# Patient Record
Sex: Male | Born: 1995 | Hispanic: Yes | Marital: Single | Smoking: Former smoker
Health system: Southern US, Community
[De-identification: ages and names within clinical notes are randomized; demographics above are authoritative.]

## PROBLEM LIST (undated history)

## (undated) ENCOUNTER — Emergency Department (HOSPITAL_COMMUNITY): Admission: EM | Payer: Commercial Managed Care - PPO | Source: Home / Self Care

## (undated) DIAGNOSIS — F4310 Post-traumatic stress disorder, unspecified: Secondary | ICD-10-CM

## (undated) DIAGNOSIS — F32A Depression, unspecified: Secondary | ICD-10-CM

## (undated) DIAGNOSIS — G43909 Migraine, unspecified, not intractable, without status migrainosus: Secondary | ICD-10-CM

## (undated) DIAGNOSIS — F329 Major depressive disorder, single episode, unspecified: Secondary | ICD-10-CM

## (undated) DIAGNOSIS — F319 Bipolar disorder, unspecified: Secondary | ICD-10-CM

## (undated) DIAGNOSIS — N35919 Unspecified urethral stricture, male, unspecified site: Secondary | ICD-10-CM

## (undated) HISTORY — PX: APPENDECTOMY: SHX54

---
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
## 2015-06-18 ENCOUNTER — Encounter (HOSPITAL_COMMUNITY): Payer: Self-pay | Admitting: Emergency Medicine

## 2015-06-18 ENCOUNTER — Emergency Department (HOSPITAL_COMMUNITY): Payer: Medicaid Other

## 2015-06-18 ENCOUNTER — Emergency Department (HOSPITAL_COMMUNITY)
Admission: EM | Admit: 2015-06-18 | Discharge: 2015-06-18 | Disposition: A | Discharge status: Home / Self Care | Payer: Medicaid Other | Attending: Emergency Medicine | Admitting: Emergency Medicine

## 2015-06-18 DIAGNOSIS — Z8679 Personal history of other diseases of the circulatory system: Secondary | ICD-10-CM | POA: Diagnosis not present

## 2015-06-18 DIAGNOSIS — W228XXA Striking against or struck by other objects, initial encounter: Secondary | ICD-10-CM | POA: Diagnosis not present

## 2015-06-18 DIAGNOSIS — Y999 Unspecified external cause status: Secondary | ICD-10-CM | POA: Diagnosis not present

## 2015-06-18 DIAGNOSIS — Z720 Tobacco use: Secondary | ICD-10-CM | POA: Insufficient documentation

## 2015-06-18 DIAGNOSIS — Z91040 Latex allergy status: Secondary | ICD-10-CM | POA: Insufficient documentation

## 2015-06-18 DIAGNOSIS — Y9389 Activity, other specified: Secondary | ICD-10-CM | POA: Insufficient documentation

## 2015-06-18 DIAGNOSIS — Z87438 Personal history of other diseases of male genital organs: Secondary | ICD-10-CM | POA: Insufficient documentation

## 2015-06-18 DIAGNOSIS — S6991XA Unspecified injury of right wrist, hand and finger(s), initial encounter: Secondary | ICD-10-CM | POA: Diagnosis present

## 2015-06-18 DIAGNOSIS — F419 Anxiety disorder, unspecified: Secondary | ICD-10-CM | POA: Diagnosis not present

## 2015-06-18 DIAGNOSIS — Y929 Unspecified place or not applicable: Secondary | ICD-10-CM | POA: Insufficient documentation

## 2015-06-18 DIAGNOSIS — M79641 Pain in right hand: Secondary | ICD-10-CM
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
## 2015-06-18 NOTE — Discharge Instructions (Signed)
Behavioral Health Resources in the Community ° °Intensive Outpatient Programs: °[facility]      °[address] °[place], [place] °[phone] °Both a day and evening program °      °[facility]     °[address]        °[place], [place] [address] °[phone]        ° °[facility] °[address] °[place] °336-882-2125 ° °[facility] °ACCESS LINE: 1-800-853-5163 or 336-641-4981 °[address] °[place], [place] [address] °[email or web address].htm ° °Mobile Crisis Teams:         °                               °[facility]         °Mobile Crisis Care Unit °1-877-626-1772       °      °Assertive °[facility] °[address] [place] °336-834-9664 °                                        °[facility] °Sharon DeEsch °[address] °[place] °336-554-5454 ° °Self-Help/Support Groups: °[facility] Variety of support groups °373-1402 (call for more info) ° °Narcotics Anonymous (NA) °[facility] °[address] °[place] - 2 meetings at this location ° °Residential Treatment Programs:  °[facility]      °[address]        °[place]       °866-801-8205        ° °[facility] °[address] °[place], [place]  [address] °[phone] ° °[facility]  °[address] °[place], [place] [address] °[phone] °Admissions: 8am-3pm M-F ° °[facility]     °[address]        °[place], [place] [address]       °336-841-1104        ° °[facility] °[address] °[place], [place] °336-379-7146 ° °[facility] °[address] °[place], [place] °336-285-9073 ° °[facility]ensive [facility]      °[address]     °[place], [place]       °852-3033        ° °[facility]        °[address] °[place], [place] °877-615-2722 or 336-784-9470 ° °[facility]  °[address] °[place], [place] °336-227-7417 ° °[facility]                                               °[address] °[place] °800-659-3381 ° °[facility]: °[facility]- 1-888-581-9988              ° °General Therapy                                                °Julie Brannon, PhD        °  [address]                                       °[place], [place] [address]         °336-349-5553   °Insurance ° °[facility]   °[address] °[place], [place] [address] °336-349-4454 ° °[facility] °[address] [place], [place] [address] °336-342-8316 °Insurance/Medicaid/sponsorship through [facility] ° °[facility]                                              °[address]                                        °[place], [place] [address]    °Therapy/tele-psych/case         °336-342-8316        °  °[facility] °[address]  ° [place],   [address]  °Adolescent/group home/case management °336-349-2233  °                                         °Julia Brannon PhD       °General therapy       °Insurance   °[phone]        ° °Dr. Arfeen °Insurance °336- [phone] °M-F ° °[facility] °Medicaid, sponsorship °336-227-7417 ° ° ° °

## 2015-06-18 NOTE — ED Provider Notes (Signed)
CSN: [id number]     Arrival date & time 06/18/15  1538 History  This chart was scribed for Roxy Horseman, PA-C, working with Lorre Nick, MD by Elon Spanner, ED Scribe. This patient was seen in room TR11C/TR11C and the patient's care was started at 5:14 PM.   Chief Complaint  Patient presents with  . Anxiety  . Hand Pain   The history is provided by the patient. No language interpreter was used.   HPI Comments: Kyle Carney is a 19 y.o. male with a history of bipolar disorder and depression who presents to the Emergency Department complaining of a painless right hand injury onset earlier today.  The patients states "I have anxiety" and "I got real mad" earlier today and punched a metal door.  He reports he has difficulty controlling his anger and always wants to hit something or someone when a person makes him mad.  He denies any current desire to harm others, HI, SI.   He reports his mother wants him to see a psychiatrist because of his anger and he is open to this idea.     Past Medical History  Diagnosis Date  . 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 Migraines   . Bipolar 1 disorder   . Depression    Past Surgical History  Procedure Laterality Date  . Appendectomy  2010  . Appendectomy     No family history on file. History  Substance Use Topics  . Smoking status: Current Some Day Smoker  . Smokeless tobacco: Never Used  . Alcohol Use: No    Review of Systems  Constitutional: Negative for fever and chills.  Respiratory: Negative for shortness of breath.   Cardiovascular: Negative for chest pain.  Gastrointestinal: Negative for nausea, vomiting, diarrhea and constipation.  Genitourinary: Negative for dysuria.  Psychiatric/Behavioral: The patient is nervous/anxious.       Allergies  Erythromycin and Latex  Home Medications   Prior to Admission medications   Not on File   BP 128/79 mmHg  Pulse 75  Temp(Src) 97.9 F  (36.6 C) (Oral)  Resp 18  SpO2 100% Physical Exam  Constitutional: He is oriented to person, place, and time. He appears well-developed and well-nourished. No distress.  HENT:  Head: Normocephalic and atraumatic.  Eyes: Conjunctivae and EOM are normal.  Neck: Neck supple. No tracheal deviation present.  Cardiovascular: Normal rate.   Intact distal pulses.  Brisk cap refill.   Pulmonary/Chest: Effort normal. No respiratory distress.  Musculoskeletal: Normal range of motion.  Right hand nontender to palpation.  No bony abnormality or deformity.  ROM and strength is 5/5.    Neurological: He is alert and oriented to person, place, and time.  Sensation intact.   Skin: Skin is warm and dry.  Psychiatric: He has a normal mood and affect. His behavior is normal.  Nursing note and vitals reviewed.   ED Course  Procedures (including critical care time)  DIAGNOSTIC STUDIES: Oxygen Saturation is 100% on RA, normal by my interpretation.    COORDINATION OF CARE:  5:22 PM Discussed treatment plan with patient at bedside.  Patient acknowledges and agrees with plan.    Labs Review Labs Reviewed - No data to display  Imaging Review No results found.   EKG Interpretation None      MDM   Final diagnoses:  Pain of right hand  Anxiety    Patient with right hand injury after punching a door.  Plain films are negative.  Asks for anger management resources.  No homicidal or suicidal ideation.  States he just get frustrated sometimes and needs to punch something, so he punches objects instead of people.    I personally performed the services described in this documentation, which was scribed in my presence. The recorded information has been reviewed and is accurate.     Roxy Horsemanobert Haiden Clucas, PA-C 06/18/15 1757  Lorre NickAnthony Allen, MD 06/18/15 [phone]

## 2015-06-18 NOTE — ED Notes (Signed)
Pt stable, ambulatory, states understanding of discharge instructions 

## 2015-06-21 ENCOUNTER — Emergency Department (HOSPITAL_COMMUNITY)
Admission: EM | Admit: 2015-06-21 | Discharge: 2015-06-23 | Payer: Medicaid Other | Attending: Emergency Medicine | Admitting: Emergency Medicine

## 2015-06-21 ENCOUNTER — Encounter (HOSPITAL_COMMUNITY): Payer: Self-pay | Admitting: Family Medicine

## 2015-06-21 DIAGNOSIS — F39 Unspecified mood [affective] disorder: Secondary | ICD-10-CM | POA: Insufficient documentation

## 2015-06-21 DIAGNOSIS — Z8679 Personal history of other diseases of the circulatory system: Secondary | ICD-10-CM | POA: Insufficient documentation

## 2015-06-21 DIAGNOSIS — Z87448 Personal history of other diseases of urinary system: Secondary | ICD-10-CM | POA: Diagnosis not present

## 2015-06-21 DIAGNOSIS — Z046 Encounter for general psychiatric examination, requested by authority: Secondary | ICD-10-CM | POA: Diagnosis present

## 2015-06-21 DIAGNOSIS — F319 Bipolar disorder, unspecified: Secondary | ICD-10-CM | POA: Diagnosis not present

## 2015-06-21 DIAGNOSIS — F1210 Cannabis abuse, uncomplicated: Secondary | ICD-10-CM | POA: Diagnosis not present

## 2015-06-21 DIAGNOSIS — Z87891 Personal history of nicotine dependence: Secondary | ICD-10-CM | POA: Insufficient documentation

## 2015-06-21 DIAGNOSIS — Z91040 Latex allergy status: Secondary | ICD-10-CM | POA: Diagnosis not present

## 2015-06-21 HISTORY — DX: Post-traumatic stress disorder, unspecified: F43.10

## 2015-06-21 LAB — CBC
HCT: 44.2 % (ref 39.0–52.0)
HEMOGLOBIN: 15 g/dL (ref 13.0–17.0)
MCH: 31.6 pg (ref 26.0–34.0)
MCHC: 33.9 g/dL (ref 30.0–36.0)
MCV: 93.2 fL (ref 78.0–100.0)
Platelets: 280 10*3/uL (ref 150–400)
RBC: 4.74 MIL/uL (ref 4.22–5.81)
RDW: 12.3 % (ref 11.5–15.5)
WBC: 9.4 10*3/uL (ref 4.0–10.5)

## 2015-06-21 LAB — COMPREHENSIVE METABOLIC PANEL
ALBUMIN: 5 g/dL (ref 3.5–5.0)
ALK PHOS: 86 U/L (ref 38–126)
ALT: 20 U/L (ref 17–63)
ANION GAP: 7 (ref 5–15)
AST: 24 U/L (ref 15–41)
BUN: 18 mg/dL (ref 6–20)
CO2: 27 mmol/L (ref 22–32)
CREATININE: 0.87 mg/dL (ref 0.61–1.24)
Calcium: 9.9 mg/dL (ref 8.9–10.3)
Chloride: 109 mmol/L (ref 101–111)
GFR calc Af Amer: >60 mL/min (ref >60)
GFR calc non Af Amer: >60 mL/min (ref >60)
Glucose, Bld: 90 mg/dL (ref 65–99)
Potassium: 4.2 mmol/L (ref 3.5–5.1)
Sodium: 143 mmol/L (ref 135–145)
TOTAL PROTEIN: 7.9 g/dL (ref 6.5–8.1)
Total Bilirubin: 0.6 mg/dL (ref 0.3–1.2)

## 2015-06-21 LAB — RAPID URINE DRUG SCREEN, HOSP PERFORMED
Amphetamines: NOT DETECTED
BARBITURATES: NOT DETECTED
BENZODIAZEPINES: NOT DETECTED
COCAINE: NOT DETECTED
Opiates: NOT DETECTED
Tetrahydrocannabinol: POSITIVE — AB

## 2015-06-21 LAB — SALICYLATE LEVEL: Salicylate Lvl: <4 mg/dL (ref 2.8–30.0)

## 2015-06-21 LAB — ACETAMINOPHEN LEVEL: Acetaminophen (Tylenol), Serum: <10 ug/mL — ABNORMAL LOW (ref 10–30)

## 2015-06-21 LAB — ETHANOL: Alcohol, Ethyl (B): <5 mg/dL (ref <5)

## 2015-06-21 MED ORDER — ACETAMINOPHEN 325 MG PO TABS
650.0000 mg | ORAL_TABLET | Freq: Once | ORAL | Status: AC
  Administered 2015-06-22: 650 mg via ORAL
  Filled 2015-06-21: qty 2

## 2015-06-21 MED ORDER — DIPHENHYDRAMINE HCL 25 MG PO CAPS
25.0000 mg | ORAL_CAPSULE | Freq: Once | ORAL | Status: AC
  Administered 2015-06-22: 25 mg via ORAL
  Filled 2015-06-21: qty 1

## 2015-06-21 MED ORDER — IBUPROFEN 200 MG PO TABS
600.0000 mg | ORAL_TABLET | Freq: Three times a day (TID) | ORAL | Status: DC | PRN
  Administered 2015-06-22: 600 mg via ORAL
  Filled 2015-06-21: qty 3

## 2015-06-21 MED ORDER — ONDANSETRON HCL 4 MG PO TABS: 4.0000 mg | ORAL_TABLET | Freq: Three times a day (TID) | ORAL | Status: DC | PRN

## 2015-06-21 MED ORDER — LORAZEPAM 0.5 MG PO TABS
1.0000 mg | ORAL_TABLET | Freq: Three times a day (TID) | ORAL | Status: DC | PRN
  Administered 2015-06-22: 1 mg via ORAL
  Filled 2015-06-21: qty 2

## 2015-06-21 MED ORDER — ACETAMINOPHEN 325 MG PO TABS: 650.0000 mg | ORAL_TABLET | ORAL | Status: DC | PRN
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
## 2015-06-21 NOTE — ED Notes (Signed)
Bed: WA30 Expected date:  Expected time:  Means of arrival:  Comments: Triage 3 

## 2015-06-21 NOTE — ED Provider Notes (Signed)
CSN: [id number]     Arrival date & time 06/21/15  2205 History   This chart was scribed for Emilia BeckKaitlyn Zosia Lucchese, PA-C working with Azalia BilisKevin Campos, MD by Elveria Risingimelie Horne, ED Scribe. This patient was seen in room WA30/WA30 and the patient's care was started at 11:05 PM.   Chief Complaint  Patient presents with  . Psychiatric Evaluation      HPI HPI Comments: Kyle Carney is a 19 y.o. male brought in by [facility] with IVC paperwork completed by mother who presents to the Emergency Department reporting that he was attacked by a swam of insects this afternoon sustaining bites over all his body. Patient reports burning pain, irritation and pruritis as a result. Patient further explains that he asked his mother to call the ambulance, because he broke his phone, but she refused. From this an argument insued. Patient adamantly denies suicidal ideation, stating that he has a girlfriend and 713 week old child in [place], his home. Patient denies recent alcohol or drug, but he shares that he has a medical marijuana license in [place] prescribed for treatment his psych disorders.   Past Medical History  Diagnosis Date  . 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 Migraines   . Bipolar 1 disorder   . Depression   . PTSD (post-traumatic stress disorder)    Past Surgical History  Procedure Laterality Date  . Appendectomy  2010  . Appendectomy     History reviewed. No pertinent family history. History  Substance Use Topics  . Smoking status: Former Games developermoker  . Smokeless tobacco: Never Used  . Alcohol Use: No    Review of Systems  Constitutional: Negative for fever.  Skin:       Insect bites  Psychiatric/Behavioral: Negative for suicidal ideas, hallucinations and self-injury.  All other systems reviewed and are negative.     Allergies  Erythromycin and Latex  Home Medications   Prior to Admission medications   Not on File   Triage Vitals: BP 132/73 mmHg  Pulse  103  Temp(Src) 97.9 F (36.6 C) (Oral)  Resp 22  Ht 5\' 7"  (1.702 m)  Wt 126 lb (57.153 kg)  BMI 19.73 kg/m2  SpO2 100% Physical Exam  Constitutional: He is oriented to person, place, and time. He appears well-developed and well-nourished. No distress.  HENT:  Head: Normocephalic and atraumatic.  Eyes: Conjunctivae and EOM are normal.  Neck: Neck supple. No tracheal deviation present.  Cardiovascular: Normal rate.   Pulmonary/Chest: Effort normal. No respiratory distress.  Abdominal: Soft. He exhibits no distension. There is no tenderness.  Musculoskeletal: Normal range of motion.  Neurological: He is alert and oriented to person, place, and time. Coordination normal.  Skin: Skin is warm and dry.  Psychiatric: He has a normal mood and affect. His behavior is normal.  Nursing note and vitals reviewed.   ED Course  Procedures (including critical care time)  COORDINATION OF CARE: 11:11 PM- Discussed treatment plan with patient at bedside and patient agreed to plan.   Labs Review Labs Reviewed  ACETAMINOPHEN LEVEL - Abnormal; Notable for the following:    Acetaminophen (Tylenol), Serum <10 (*)    All other components within normal limits  URINE RAPID DRUG SCREEN, HOSP PERFORMED - Abnormal; Notable for the following:    Tetrahydrocannabinol POSITIVE (*)    All other components within normal limits  COMPREHENSIVE METABOLIC PANEL  ETHANOL  SALICYLATE LEVEL  CBC    Imaging Review No results  found.   EKG Interpretation None      MDM   Final diagnoses:  None    Patient will be evaluated by TTS.   I personally performed the services described in this documentation, which was scribed in my presence. The recorded information has been reviewed and is accurate.    Emilia Beck, PA-C 06/22/15 1610  Azalia Bilis, MD 06/23/15 [phone]

## 2015-06-21 NOTE — ED Notes (Signed)
Patient on tele-psych consult at this time.

## 2015-06-22 DIAGNOSIS — F39 Unspecified mood [affective] disorder: Secondary | ICD-10-CM | POA: Diagnosis present

## 2015-06-22 MED ORDER — DIPHENHYDRAMINE HCL 25 MG PO CAPS
50.0000 mg | ORAL_CAPSULE | Freq: Four times a day (QID) | ORAL | Status: DC | PRN
  Administered 2015-06-22: 50 mg via ORAL
  Filled 2015-06-22: qty 2

## 2015-06-22 NOTE — BH Assessment (Signed)
Tele Assessment Note   Kyle Carney is a 19 y.o. male who presents to [facility] via IVC petition, initiated by his mother.  Pt reports that he is visiting from [place] at the request of his mother because of poor health.  Pt states that today he and his mother had an argument because he was stung by hornets(visible red marks on his face, unk if actual bee stings) and she refused to call EMS to transport him to the [facility].  Pt states he got mad and broke his phone and soon after the police arrived and brought him the the hospital.  Pt denies SI/HI/AVH.  Per petition, mother states that pt tried to hang himself and was allegedly walking around the neighborhood with a machete prior to police arriving at his home.  Pt admits previous hx of SI attempts x3 by overdose and says he attempted because he was attention seeking.  Pt has prior inpt admissions with [facility] and [facility].  Pt adamantly denies these allegations, stating that he has a daughter and girlfriend waiting for him in [place] and his flight leaves at [employer]. Pt states he is prescribed medical marijuana for anxiety and ptsd. Per Donell Sievert, PA recommends AM psych eval to uphold/rescind petition.  Axis I: Bipolar I disorder, Current or most recent episode unspecified;Posttraumatic stress disorder  Axis II: Deferred Axis III:  Past Medical History  Diagnosis Date  . 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 Migraines   . Bipolar 1 disorder   . Depression   . PTSD (post-traumatic stress disorder)    Axis IV: other psychosocial or environmental problems, problems related to legal system/crime, problems related to social environment and problems with primary support group Axis V: 41-50 serious symptoms  Past Medical History:  Past Medical History  Diagnosis Date  . 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 Migraines   . Bipolar 1 disorder   . Depression   . PTSD  (post-traumatic stress disorder)     Past Surgical History  Procedure Laterality Date  . Appendectomy  2010  . Appendectomy      Family History: History reviewed. No pertinent family history.  Social History:  reports that he has quit smoking. He has never used smokeless tobacco. He reports that he uses illicit drugs (Marijuana). He reports that he does not drink alcohol.  Additional Social History:  Alcohol / Drug Use Pain Medications: None reported  Prescriptions: None reported  Over the Counter: None reported  History of alcohol / drug use?: Yes Longest period of sobriety (when/how long): None  Negative Consequences of Use: Personal relationships Withdrawal Symptoms: Other (Comment) (No w/d sxs ) Substance #1 Name of Substance 1: THC  1 - Age of First Use: Teens  1 - Amount (size/oz): 0.5 grams  1 - Frequency: 3-5x's Wkly  1 - Duration: On-going  1 - Last Use / Amount: 2 Wks Ago   CIWA: CIWA-Ar BP: 132/73 mmHg Pulse Rate: 103 COWS:    PATIENT STRENGTHS: (choose at least two) Communication skills  Allergies:  Allergies  Allergen Reactions  . Erythromycin Rash  . Latex Rash    Home Medications:  (Not in a hospital admission)  OB/GYN Status:  No LMP for male patient.  General Assessment Data [facility] of Assessment: [facility] TTS Assessment: In system Is this a Tele or Face-to-Face Assessment?: Tele Assessment Is this an Initial Assessment  or a Re-assessment for this encounter?: Initial Assessment Marital status: Long term relationship Maiden name: None  Is patient pregnant?: No Pregnancy Status: No Living Arrangements: Spouse/significant other, Children (Lives with girlfriend and daughter ) Can pt return to current living arrangement?: Yes Admission Status: Involuntary Is patient capable of signing voluntary admission?: No Referral Source: MD Insurance type: [facility]   Medical Screening Exam [facility] Walk-in ONLY) Medical Exam completed: No Reason for MSE not  completed: Other: (None )  Crisis Care Plan Living Arrangements: Spouse/significant other, Children (Lives with girlfriend and daughter ) Name of Psychiatrist: None  Name of Therapist: [place]--[facility]   Education Status Is patient currently in school?: No Current Grade: None  Highest grade of school patient has completed: None  Name of school: None  Contact person: None   Risk to self with the past 6 months Suicidal Ideation: No Has patient been a risk to self within the past 6 months prior to admission? : No Suicidal Intent: No Has patient had any suicidal intent within the past 6 months prior to admission? : No Is patient at risk for suicide?: No Suicidal Plan?: No Has patient had any suicidal plan within the past 6 months prior to admission? : No Access to Means: No What has been your use of drugs/alcohol within the last 12 months?: Pt reports using medical marijuana  Previous Attempts/Gestures: Yes How many times?: 3 Other Self Harm Risks: None  Triggers for Past Attempts: Family contact, Other personal contacts, Unpredictable Intentional Self Injurious Behavior: None Family Suicide History: No Recent stressful life event(s): Conflict (Comment) (Issues with family and neighbors ) Persecutory voices/beliefs?: No Depression: Yes Depression Symptoms: Loss of interest in usual pleasures Substance abuse history and/or treatment for substance abuse?: No Suicide prevention information given to non-admitted patients: Not applicable  Risk to Others within the past 6 months Homicidal Ideation: No Does patient have any lifetime risk of violence toward others beyond the six months prior to admission? : No Thoughts of Harm to Others: No Current Homicidal Intent: No Current Homicidal Plan: No Access to Homicidal Means: No Identified Victim: None  History of harm to others?: No Assessment of Violence: None Noted Violent Behavior Description: None  Does patient have access to  weapons?: No Criminal Charges Pending?: Yes Describe Pending Criminal Charges: Possession of a stolen vehicle Does patient have a court date: Yes Court Date: 07/25/15 Is patient on probation?: No  Psychosis Hallucinations: None noted Delusions: None noted  Mental Status Report Appearance/Hygiene: In scrubs Eye Contact: Good Motor Activity: Unremarkable Speech: Logical/coherent, Soft Level of Consciousness: Alert Mood: Other (Comment) (Appropriate ) Affect: Appropriate to circumstance Anxiety Level: Minimal Thought Processes: Coherent, Relevant Judgement: Partial Orientation: Person, Place, Time, Situation Obsessive Compulsive Thoughts/Behaviors: None  Cognitive Functioning Concentration: Normal Memory: Recent Intact, Remote Intact IQ: Average Insight: Fair Impulse Control: Fair Appetite: Good Weight Loss: 0 Weight Gain: 0 Sleep: No Change Total Hours of Sleep: 6 Vegetative Symptoms: None  ADLScreening [facility] Assessment Services) Patient's cognitive ability adequate to safely complete daily activities?: Yes Patient able to express need for assistance with ADLs?: Yes Independently performs ADLs?: Yes (appropriate for developmental age)  Prior Inpatient Therapy Prior Inpatient Therapy: Yes Prior Therapy Dates: 2010 [facility]  Reason for Treatment: Bipolar D/O   Prior Outpatient Therapy Prior Outpatient Therapy: Yes Prior Therapy Dates: Current  Prior Therapy Facilty/Provider(s): [facility]-Therapist  Reason for Treatment: Therapy  Does patient have an ACCT team?: No Does patient have Intensive In-House  Services?  : No Does patient have [facility] services? : No Does patient have P4CC services?: No  ADL Screening (condition at time of admission) Patient's cognitive ability adequate to safely complete daily activities?: Yes Is the patient deaf or have difficulty hearing?: No Does the patient have difficulty  seeing, even when wearing glasses/contacts?: No Does the patient have difficulty concentrating, remembering, or making decisions?: No Patient able to express need for assistance with ADLs?: Yes Does the patient have difficulty dressing or bathing?: No Independently performs ADLs?: Yes (appropriate for developmental age) Does the patient have difficulty walking or climbing stairs?: No Weakness of Legs: None Weakness of Arms/Hands: None  Home Assistive Devices/Equipment Home Assistive Devices/Equipment: None  Therapy Consults (therapy consults require a physician order) PT Evaluation Needed: No OT Evalulation Needed: No SLP Evaluation Needed: No Abuse/Neglect Assessment (Assessment to be complete while patient is alone) Physical Abuse: Denies Verbal Abuse: Denies Sexual Abuse: Denies Exploitation of patient/patient's resources: Denies Self-Neglect: Denies Values / Beliefs Cultural Requests During Hospitalization: None Spiritual Requests During Hospitalization: None Consults Spiritual Care Consult Needed: No Social Work Consult Needed: No Merchant navy officer (For Healthcare) Does patient have an advance directive?: No Would patient like information on creating an advanced directive?: No - patient declined information Nutrition Screen- [facility] Patient's home diet: Regular Has the patient recently lost weight without trying?: No Has the patient been eating poorly because of a decreased appetite?: No Malnutrition Screening Tool Score: 0  Additional Information 1:1 In Past 12 Months?: No CIRT Risk: No Elopement Risk: No Does patient have medical clearance?: Yes     Disposition:  Disposition Initial Assessment Completed for this Encounter: Yes Disposition of Patient: Referred to (Per Donell Sievert, PA recommends AM psych eval ) Patient referred to: Other (Comment) (Per Donell Sievert, PA AM peych eval for final disposition )  Murrell Redden 06/22/2015 12:34 AM

## 2015-06-22 NOTE — BH Assessment (Signed)
[facility] Assessment Progress Note  Per Thedore MinsMojeed Akintayo, MD, this pt requires psychiatric hospitalization. At 16:30 I received a call from [place] [facility]. Pt has been accepted to their facility by Dr. Wendall StadeKohl. However, he is not to be transported until tomorrow, 06/23/2015, after 07:00. At that time please call report to [phone]. Suan HalterJosephine Onhuoha, NP concurs with this decision. Pt's nurse has been informed.  Doylene Canninghomas Idaliz Tinkle, MA Triage Specialist [phone]

## 2015-06-22 NOTE — ED Notes (Signed)
Patient reports he was increasing in agitation because he is ready to go. Patient understands he is under psychiatric care and is trying to calm and occupy himself at present. Continues to blame his mother for his hospitalization. Reports long history of problems with his mother and her boyfriend.  Encouragement offered. Benadryl given.  Q 15 safety checks continue.

## 2015-06-22 NOTE — ED Notes (Signed)
Patient has been cooperative since arriving on the unit.  He denies any thoughts of harm to self or others.  He denies hallucinations.  He also denies that he ever tried to hurt himself or anyone else.  Social Worker spoke with patients mother and girlfriend and mother does not feel patient is stable to be released.

## 2015-06-22 NOTE — ED Provider Notes (Signed)
Asked by psych to IVC patient. Apparently he came in under IVC but they rescinded it. However after talking to mom the patient's story has become more concerning and there is a concern that he will actually go out and harm people and that he is a danger to others. Will IVC  Pricilla LovelessScott Eluzer Howdeshell, MD 06/22/15 [phone]

## 2015-06-22 NOTE — Progress Notes (Signed)
CSW called to obtain further collateral from pt mother, at [phone]. Ms. Fernand ParkinsMancebo requested csw call back after 1230 as she is as work until then.   Olga CoasterKristen Taquanna Borras, LCSW  Clinical Social Work  [facility] [phone]

## 2015-06-22 NOTE — ED Notes (Signed)
MD at bedside. 
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
## 2015-06-22 NOTE — ED Notes (Signed)
Patient restless. Complains of pain and itching related to bee stings. Denies SI, HI, AVH. Reports use of THC. States that "My mom has been doing this to me since I was a kid". Patient states he did not try to harm himself.   Encouragement offered.  Q 15 safety checks continue.

## 2015-06-22 NOTE — BH Assessment (Addendum)
[facility] Assessment Progress Note  Per Thedore MinsMojeed Akintayo, MD, this pt does not require psychiatric hospitalization at this time.  [facility].  IVC has been rescinded.  [facility] referral information has been included in pt's discharge instructions.  Dr. Jannifer FranklinAkintayo requests that pt's mother be called to notify her; call is pending at this time.  Pt's nurse has been notified.  Doylene Canninghomas Kei Langhorst, MA Triage Specialist [phone]   Addendum:  After further investigation it has been found that pt requires psychiatric hospitalization.  IVC has been reinstated by EDP Pricilla LovelessScott Goldston, MD.  Placement will be sought.  Doylene Canninghomas Judithann Villamar, MA Triage Specialist [phone]
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
## 2015-06-22 NOTE — Discharge Instructions (Signed)
For your ongoing mental health needs, you are advised to continue treatment [facility]:       [facility]      [address]      [place], [place] [address]      [phone]

## 2015-06-23 NOTE — ED Notes (Signed)
[facility] was [facility]. Patient previously refused to sign e-signature.   Requan Hardge, Wyman SongsterAngela Marie, RN

## 2018-01-15 ENCOUNTER — Encounter (HOSPITAL_COMMUNITY): Payer: Self-pay | Admitting: *Deleted

## 2018-01-15 ENCOUNTER — Other Ambulatory Visit: Payer: Self-pay

## 2018-01-15 ENCOUNTER — Emergency Department (HOSPITAL_COMMUNITY)
Admission: EM | Admit: 2018-01-15 | Discharge: 2018-01-16 | Disposition: A | Discharge status: Home / Self Care | Payer: Self-pay | Attending: Emergency Medicine | Admitting: Emergency Medicine

## 2018-01-15 ENCOUNTER — Emergency Department (HOSPITAL_COMMUNITY): Payer: Self-pay

## 2018-01-15 DIAGNOSIS — R197 Diarrhea, unspecified: Secondary | ICD-10-CM | POA: Insufficient documentation

## 2018-01-15 DIAGNOSIS — R05 Cough: Secondary | ICD-10-CM | POA: Insufficient documentation

## 2018-01-15 DIAGNOSIS — Z87891 Personal history of nicotine dependence: Secondary | ICD-10-CM | POA: Insufficient documentation

## 2018-01-15 DIAGNOSIS — R6889 Other general symptoms and signs: Secondary | ICD-10-CM

## 2018-01-15 DIAGNOSIS — R509 Fever, unspecified: Secondary | ICD-10-CM | POA: Insufficient documentation

## 2018-01-15 DIAGNOSIS — R0602 Shortness of breath: Secondary | ICD-10-CM | POA: Insufficient documentation

## 2018-01-15 DIAGNOSIS — R52 Pain, unspecified: Secondary | ICD-10-CM | POA: Insufficient documentation

## 2018-01-15 DIAGNOSIS — R1110 Vomiting, unspecified: Secondary | ICD-10-CM | POA: Insufficient documentation

## 2018-01-15 LAB — URINALYSIS, ROUTINE W REFLEX MICROSCOPIC
Bacteria, UA: NONE SEEN
Bilirubin Urine: NEGATIVE
Glucose, UA: NEGATIVE mg/dL
Hgb urine dipstick: NEGATIVE
Ketones, ur: 5 mg/dL — AB
Nitrite: NEGATIVE
Protein, ur: NEGATIVE mg/dL
Specific Gravity, Urine: 1.029 (ref 1.005–1.030)
pH: 5 (ref 5.0–8.0)

## 2018-01-15 LAB — CBC
HCT: 45.1 % (ref 39.0–52.0)
HEMOGLOBIN: 14.6 g/dL (ref 13.0–17.0)
MCH: 30.2 pg (ref 26.0–34.0)
MCHC: 32.4 g/dL (ref 30.0–36.0)
MCV: 93.4 fL (ref 78.0–100.0)
Platelets: 289 10*3/uL (ref 150–400)
RBC: 4.83 MIL/uL (ref 4.22–5.81)
RDW: 13.1 % (ref 11.5–15.5)
WBC: 11.9 10*3/uL — ABNORMAL HIGH (ref 4.0–10.5)

## 2018-01-15 LAB — BASIC METABOLIC PANEL WITH GFR
Anion gap: 11 (ref 5–15)
BUN: 9 mg/dL (ref 6–20)
CO2: 24 mmol/L (ref 22–32)
Calcium: 9.3 mg/dL (ref 8.9–10.3)
Chloride: 103 mmol/L (ref 101–111)
Creatinine, Ser: 0.91 mg/dL (ref 0.61–1.24)
GFR calc Af Amer: >60 mL/min
GFR calc non Af Amer: >60 mL/min
Glucose, Bld: 105 mg/dL — ABNORMAL HIGH (ref 65–99)
Potassium: 3.8 mmol/L (ref 3.5–5.1)
Sodium: 138 mmol/L (ref 135–145)

## 2018-01-15 MED ORDER — LEVOFLOXACIN 750 MG PO TABS
750.0000 mg | ORAL_TABLET | Freq: Once | ORAL | Status: AC
  Administered 2018-01-15: 750 mg via ORAL
  Filled 2018-01-15: qty 1

## 2018-01-15 MED ORDER — KETOROLAC TROMETHAMINE 60 MG/2ML IM SOLN
30.0000 mg | Freq: Once | INTRAMUSCULAR | Status: AC
  Administered 2018-01-15: 30 mg via INTRAMUSCULAR
  Filled 2018-01-15: qty 2

## 2018-01-15 MED ORDER — IBUPROFEN 400 MG PO TABS: 600.0000 mg | ORAL_TABLET | Freq: Once | ORAL | Status: DC

## 2018-01-15 MED ORDER — ACETAMINOPHEN 500 MG PO TABS
1000.0000 mg | ORAL_TABLET | Freq: Once | ORAL | Status: AC
  Administered 2018-01-15: 1000 mg via ORAL
  Filled 2018-01-15: qty 2

## 2018-01-15 MED ORDER — BENZONATATE 100 MG PO CAPS
200.0000 mg | ORAL_CAPSULE | Freq: Once | ORAL | Status: AC
  Administered 2018-01-15: 200 mg via ORAL
  Filled 2018-01-15: qty 2

## 2018-01-15 NOTE — ED Notes (Signed)
Placed and order for urinalysis as pt added that he has been having "abdominal pain and bloody urine"

## 2018-01-15 NOTE — ED Provider Notes (Signed)
[facility] EMERGENCY DEPARTMENT Provider Note   CSN: [id number] Arrival date & time: 01/15/18  1831    History   Chief Complaint Chief Complaint  Patient presents with  . Cough    HPI Kyle Carney is a 22 y.o. male.  22 year old male with a history of bipolar 1 disorder, depression, PTSD presents to the emergency department for evaluation of cough and shortness of breath.  Symptoms have been present over the past 2 days.  Patient notes worsening symptoms with associated body aches and subjective fever.  He has had some vomiting provoked by frequent coughing as well as diarrhea.  Coughing notably causes pleuritic chest pain.  No known sick contacts.  He does report darker colored urine which has been chronic, over the past few months.  He states that he remains well-hydrated by drinking plenty of water.  He denies any associated dysuria.  No abdominal pain.  No medications taken prior to arrival for symptoms.   The history is provided by the patient and a parent. No language interpreter was used.  Cough     Past Medical History:  Diagnosis Date  . Bipolar 1 disorder (HCC)   . Depression   . Migraines   . PTSD (post-traumatic stress disorder)   . Stricture of urethra    suspected - urology appointment upcoming    Patient Active Problem List   Diagnosis Date Noted  . Mood disorder (HCC) 06/22/2015  . Migraine without aura, without mention of intractable migraine without mention of status migrainosus 03/04/2013  . Episodic tension type headache 03/04/2013  . Concussion with no loss of consciousness 03/04/2013    Past Surgical History:  Procedure Laterality Date  . APPENDECTOMY  2010  . APPENDECTOMY         Home Medications    Prior to Admission medications   Medication Sig Start Date End Date Taking? Authorizing Provider  acetaminophen (TYLENOL) 500 MG tablet Take 2 tablets (1,000 mg total) by mouth every 8 (eight) hours as needed for  fever or headache. 01/16/18   Antony Madura, PA-C  benzonatate (TESSALON) 100 MG capsule Take 1-2 capsules (100-200 mg total) by mouth 3 (three) times daily as needed for cough. 01/16/18   Antony Madura, PA-C  divalproex (DEPAKOTE ER) 500 MG 24 hr tablet Take 500 mg by mouth at bedtime.    [provider]  ibuprofen (ADVIL,MOTRIN) 600 MG tablet Take 1 tablet (600 mg total) by mouth every 6 (six) hours as needed for fever, mild pain or moderate pain. 01/16/18   Antony Madura, PA-C  levofloxacin (LEVAQUIN) 500 MG tablet Take 1.5 tablets (750 mg total) by mouth daily. 01/16/18   Antony Madura, PA-C    Family History No family history on file.  Social History Social History   Tobacco Use  . Smoking status: Former Games developer  . Smokeless tobacco: Never Used  Substance Use Topics  . Alcohol use: No  . Drug use: Yes    Types: Marijuana    Comment: Used 3 weeks ago.      Allergies   Erythromycin and Latex   Review of Systems Review of Systems  Respiratory: Positive for cough.   Ten systems reviewed and are negative for acute change, except as noted in the HPI.    Physical Exam Updated Vital Signs BP 135/75 (BP Location: Right Arm)   Pulse (!) 103   Temp 100.1 F (37.8 C) (Oral)   Resp 16   Ht 5\' 7"  (1.702 m)  Wt 93 kg (205 lb)   SpO2 97%   BMI 32.11 kg/m   Physical Exam  Constitutional: He is oriented to person, place, and time. He appears well-developed and well-nourished. No distress.  Nontoxic appearing and in no acute distress  HENT:  Head: Normocephalic and atraumatic.  Eyes: Conjunctivae and EOM are normal. No scleral icterus.  Neck: Normal range of motion.  Cardiovascular: Regular rhythm and intact distal pulses.  Mild tachycardia  Pulmonary/Chest: Effort normal. No stridor. No respiratory distress. He has no wheezes. He has no rales.  Sporadic dry, nonproductive cough.  Lungs grossly clear to auscultation bilaterally.  No accessory muscle use.  Musculoskeletal:  Normal range of motion.  Neurological: He is alert and oriented to person, place, and time. He exhibits normal muscle tone. Coordination normal.  Skin: Skin is warm and dry. No rash noted. He is not diaphoretic. No erythema. No pallor.  Psychiatric: He has a normal mood and affect. His behavior is normal.  Nursing note and vitals reviewed.    ED Treatments / Results  Labs (all labs ordered are listed, but only abnormal results are displayed) Labs Reviewed  CBC - Abnormal; Notable for the following components:      Result Value   WBC 11.9 (*)    All other components within normal limits  BASIC METABOLIC PANEL - Abnormal; Notable for the following components:   Glucose, Bld 105 (*)    All other components within normal limits  URINALYSIS, ROUTINE W REFLEX MICROSCOPIC - Abnormal; Notable for the following components:   Ketones, ur 5 (*)    Leukocytes, UA TRACE (*)    Squamous Epithelial / LPF 0-5 (*)    All other components within normal limits  GC/CHLAMYDIA PROBE AMP ([place]) NOT AT [facility]    EKG  EKG Interpretation None       Radiology Dg Chest 2 View  Result Date: 01/15/2018 CLINICAL DATA:  Cough and shortness of breath. EXAM: CHEST  2 VIEW COMPARISON:  December 05, 2013 FINDINGS: There is a triangular opacity projected over the medial left heart on the frontal view, not appreciated on the lateral view. The heart, hila, mediastinum, lungs, and pleura are otherwise unremarkable. IMPRESSION: Triangular opacity project over the heart on the frontal view only may represent confluence of shadows or atelectasis. A subtle infiltrate is not excluded. Electronically Signed   By: Gerome Sam III M.D   On: 01/15/2018 21:14    Procedures Procedures (including critical care time)  Medications Ordered in ED Medications  benzonatate (TESSALON) capsule 200 mg (200 mg Oral Given 01/15/18 2348)  ketorolac (TORADOL) injection 30 mg (30 mg Intramuscular Given 01/15/18 2349)    acetaminophen (TYLENOL) tablet 1,000 mg (1,000 mg Oral Given 01/15/18 2349)  levofloxacin (LEVAQUIN) tablet 750 mg (750 mg Oral Given 01/15/18 2348)     Initial Impression / Assessment and Plan / ED Course  I have reviewed the triage vital signs and the nursing notes.  Pertinent labs & imaging results that were available during my care of the patient were reviewed by me and considered in my medical decision making (see chart for details).     Patient with symptoms consistent with influenza.  Vitals are stable, low-grade fever.  No signs of dehydration, tolerating PO's.  Lungs are clear.  No hypoxia, tachypnea.  A chest x-ray was obtained; ordered in triage.  This shows findings consistent with possible early pneumonia.  Plan for coverage with Levaquin.  He was given Tylenol and Toradol  in the ED for symptomatic management.  Tessalon given for cough.     Fever responding appropriately to antipyretics.  Patient will be discharged with instructions to orally hydrate and rest.  I have advised NSAIDs for muscle aches and Tylenol for fever.  Patient will also be given a cough suppressant.  Return precautions discussed and provided.  Patient discharged in stable condition with no unaddressed concerns.   Final Clinical Impressions(s) / ED Diagnoses   Final diagnoses:  Flu-like symptoms    ED Discharge Orders        Ordered    levofloxacin (LEVAQUIN) 500 MG tablet  Daily     01/16/18 0007    benzonatate (TESSALON) 100 MG capsule  3 times daily PRN     01/16/18 0007    ibuprofen (ADVIL,MOTRIN) 600 MG tablet  Every 6 hours PRN     01/16/18 0007    acetaminophen (TYLENOL) 500 MG tablet  Every 8 hours PRN     01/16/18 0007       Antony MaduraHumes, Deundre Thong, PA-C 01/16/18 0522    Nira Connardama, Pedro Eduardo, MD 01/17/18 1123

## 2018-01-15 NOTE — ED Triage Notes (Signed)
Pt is here for cough and sob.  Pt states that he has been having some sob for "a while" and states that he was in jail for 2 years, got out on 1/22. Placed mask on pt.

## 2018-01-16 LAB — GC/CHLAMYDIA PROBE AMP (~~LOC~~) NOT AT ARMC
CHLAMYDIA, DNA PROBE: NEGATIVE
Neisseria Gonorrhea: NEGATIVE

## 2018-01-16 MED ORDER — IBUPROFEN 600 MG PO TABS: 600.0000 mg | ORAL_TABLET | Freq: Four times a day (QID) | ORAL | 0 refills | Status: DC | PRN

## 2018-01-16 MED ORDER — LEVOFLOXACIN 500 MG PO TABS: 750.0000 mg | ORAL_TABLET | Freq: Every day | ORAL | 0 refills | Status: DC

## 2018-01-16 MED ORDER — ACETAMINOPHEN 500 MG PO TABS: 1000.0000 mg | ORAL_TABLET | Freq: Three times a day (TID) | ORAL | 0 refills | Status: DC | PRN

## 2018-01-16 MED ORDER — BENZONATATE 100 MG PO CAPS: 100.0000 mg | ORAL_CAPSULE | Freq: Three times a day (TID) | ORAL | 0 refills | Status: DC | PRN

## 2018-01-16 NOTE — Discharge Instructions (Signed)
We believe that your symptoms today are due to the flu.  It is possible that you may also have a mild, developing pneumonia.  You have been placed on an antibiotic for this reason.  We advised the use of Tessalon for cough as prescribed.  Continue with ibuprofen for pain or body aches.  You may supplement this with Tylenol as prescribed for fever management.  Be sure to drink plenty of fluids to prevent dehydration.  Practice good handwashing to prevent from making others around you sick.  Follow-up with a primary care doctor.  Return to the ED for new or concerning symptoms.

## 2018-02-25 ENCOUNTER — Emergency Department (HOSPITAL_COMMUNITY)
Admission: EM | Admit: 2018-02-25 | Discharge: 2018-02-25 | Disposition: A | Discharge status: Home / Self Care | Payer: Self-pay | Attending: Emergency Medicine | Admitting: Emergency Medicine

## 2018-02-25 ENCOUNTER — Emergency Department (HOSPITAL_COMMUNITY): Payer: Self-pay

## 2018-02-25 ENCOUNTER — Encounter (HOSPITAL_COMMUNITY): Payer: Self-pay | Admitting: Emergency Medicine

## 2018-02-25 ENCOUNTER — Other Ambulatory Visit: Payer: Self-pay

## 2018-02-25 DIAGNOSIS — Z87891 Personal history of nicotine dependence: Secondary | ICD-10-CM | POA: Insufficient documentation

## 2018-02-25 DIAGNOSIS — R55 Syncope and collapse: Secondary | ICD-10-CM | POA: Insufficient documentation

## 2018-02-25 LAB — BASIC METABOLIC PANEL
Anion gap: 10 (ref 5–15)
BUN: 11 mg/dL (ref 6–20)
CHLORIDE: 105 mmol/L (ref 101–111)
CO2: 24 mmol/L (ref 22–32)
CREATININE: 0.92 mg/dL (ref 0.61–1.24)
Calcium: 9.5 mg/dL (ref 8.9–10.3)
GFR calc non Af Amer: >60 mL/min (ref >60)
Glucose, Bld: 103 mg/dL — ABNORMAL HIGH (ref 65–99)
Potassium: 3.7 mmol/L (ref 3.5–5.1)
SODIUM: 139 mmol/L (ref 135–145)

## 2018-02-25 LAB — CBC
HCT: 44.1 % (ref 39.0–52.0)
Hemoglobin: 14.7 g/dL (ref 13.0–17.0)
MCH: 30.6 pg (ref 26.0–34.0)
MCHC: 33.3 g/dL (ref 30.0–36.0)
MCV: 91.7 fL (ref 78.0–100.0)
PLATELETS: 303 10*3/uL (ref 150–400)
RBC: 4.81 MIL/uL (ref 4.22–5.81)
RDW: 13.1 % (ref 11.5–15.5)
WBC: 10.7 10*3/uL — ABNORMAL HIGH (ref 4.0–10.5)

## 2018-02-25 LAB — URINALYSIS, ROUTINE W REFLEX MICROSCOPIC
Glucose, UA: NEGATIVE mg/dL
Hgb urine dipstick: NEGATIVE
Ketones, ur: 5 mg/dL — AB
Leukocytes, UA: NEGATIVE
NITRITE: NEGATIVE
PROTEIN: 30 mg/dL — AB
SPECIFIC GRAVITY, URINE: 1.034 — AB (ref 1.005–1.030)
SQUAMOUS EPITHELIAL / LPF: NONE SEEN
pH: 5 (ref 5.0–8.0)

## 2018-02-25 LAB — I-STAT TROPONIN, ED: Troponin i, poc: 0 ng/mL (ref 0.00–0.08)

## 2018-02-25 MED ORDER — CLONIDINE HCL 0.2 MG PO TABS: 0.2000 mg | ORAL_TABLET | Freq: Two times a day (BID) | ORAL | 0 refills | Status: DC

## 2018-02-25 MED ORDER — MIRTAZAPINE 45 MG PO TABS: 45.0000 mg | ORAL_TABLET | Freq: Every day | ORAL | 3 refills | Status: DC

## 2018-02-25 NOTE — ED Provider Notes (Signed)
[facility] EMERGENCY DEPARTMENT Provider Note   CSN: [id number] Arrival date & time: 02/25/18  0115     History   Chief Complaint Chief Complaint  Patient presents with  . Loss of Consciousness  . Chest Pain    HPI Kyle Carney is a 22 y.o. male.  HPI Patient presents the emergency department with reported syncopal episode yesterday.  I spoke with the patient's mother who states that the aunt witnessed this.  He was sitting in suddenly his eyes rolled back in his head and he fell to the ground from a seated position.  There was possible seizure-like activity.  His symptoms lasted approximately 1 minute.  It took him some time until he came completely back to baseline.  He did have a history of seizures as a child.  He was on phenobarbital up until age 50.  He was no longer on antiepileptic drugs past that.  He has had no recurrent seizures.  He denies bowel or bladder complaints.  Did not bite his tongue.  No weakness of his arms or legs.  Denies recent head injury.  No headache at this time.  Denies blurred vision.  No preceding chest pain or palpitations.  Feels normal at this time.   Past Medical History:  Diagnosis Date  . Bipolar 1 disorder (HCC)   . Depression   . Migraines   . PTSD (post-traumatic stress disorder)   . Stricture of urethra    suspected - urology appointment upcoming    Patient Active Problem List   Diagnosis Date Noted  . Mood disorder (HCC) 06/22/2015  . Migraine without aura, without mention of intractable migraine without mention of status migrainosus 03/04/2013  . Episodic tension type headache 03/04/2013  . Concussion with no loss of consciousness 03/04/2013    Past Surgical History:  Procedure Laterality Date  . APPENDECTOMY  2010  . APPENDECTOMY         Home Medications    Prior to Admission medications   Medication Sig Start Date End Date Taking? Authorizing Provider  cloNIDine (CATAPRES) 0.2 MG tablet  Take 1 tablet (0.2 mg total) by mouth 2 (two) times daily. 02/25/18   Azalia Bilis, MD  mirtazapine (REMERON) 45 MG tablet Take 1 tablet (45 mg total) by mouth at bedtime. 02/25/18   Azalia Bilis, MD    Family History History reviewed. No pertinent family history.  Social History Social History   Tobacco Use  . Smoking status: Former Games developer  . Smokeless tobacco: Never Used  Substance Use Topics  . Alcohol use: No  . Drug use: Yes    Types: Marijuana    Comment: Used 3 weeks ago.      Allergies   Erythromycin and Latex   Review of Systems Review of Systems  All other systems reviewed and are negative.    Physical Exam Updated Vital Signs BP 111/74 (BP Location: Right Arm)   Pulse 80   Temp 98.6 F (37 C) (Oral)   Resp 18   Ht 5\' 7"  (1.702 m)   Wt 77.1 kg (170 lb)   SpO2 98%   BMI 26.63 kg/m   Physical Exam  Constitutional: He is oriented to person, place, and time. He appears well-developed and well-nourished.  HENT:  Head: Normocephalic and atraumatic.  Eyes: EOM are normal. Pupils are equal, round, and reactive to light.  Neck: Normal range of motion.  Cardiovascular: Normal rate, regular rhythm, normal heart sounds and intact distal pulses.  Pulmonary/Chest: Effort normal and breath sounds normal. No respiratory distress.  Abdominal: Soft. He exhibits no distension. There is no tenderness.  Musculoskeletal: Normal range of motion.  Neurological: He is alert and oriented to person, place, and time.  5/5 strength in major muscle groups of  bilateral upper and lower extremities. Speech normal. No facial asymetry.   Skin: Skin is warm and dry.  Psychiatric: He has a normal mood and affect. Judgment normal.  Nursing note and vitals reviewed.    ED Treatments / Results  Labs (all labs ordered are listed, but only abnormal results are displayed) Labs Reviewed  BASIC METABOLIC PANEL - Abnormal; Notable for the following components:      Result Value    Glucose, Bld 103 (*)    All other components within normal limits  CBC - Abnormal; Notable for the following components:   WBC 10.7 (*)    All other components within normal limits  URINALYSIS, ROUTINE W REFLEX MICROSCOPIC - Abnormal; Notable for the following components:   Color, Urine AMBER (*)    APPearance HAZY (*)    Specific Gravity, Urine 1.034 (*)    Bilirubin Urine SMALL (*)    Ketones, ur 5 (*)    Protein, ur 30 (*)    Bacteria, UA RARE (*)    All other components within normal limits  I-STAT TROPONIN, ED    EKG  EKG Interpretation  Date/Time:  Wednesday February 25 2018 01:38:20 EDT Ventricular Rate:  93 PR Interval:  142 QRS Duration: 80 QT Interval:  338 QTC Calculation: 420 R Axis:   74 Text Interpretation:  Normal sinus rhythm Normal ECG When compared with ECG of 12/05/2013, No significant change was found Confirmed by Dione BoozeGlick, David ([id number]) on 02/25/2018 6:19:07 AM Also confirmed by Dione BoozeGlick, David ([id number]), editor Elita QuickWatlington, [place] [phone]([id number])  on 02/25/2018 7:15:52 AM       Radiology Dg Chest 2 View  Result Date: 02/25/2018 CLINICAL DATA:  Chest pain. EXAM: CHEST - 2 VIEW COMPARISON:  Radiograph 01/15/2018 FINDINGS: The cardiomediastinal contours are normal. Previous opacity projecting over the lower cardiac border has resolved. Pulmonary vasculature is normal. No consolidation, pleural effusion, or pneumothorax. No acute osseous abnormalities are seen. IMPRESSION: Unremarkable radiographs of the chest. Electronically Signed   By: Rubye OaksMelanie  Ehinger M.D.   On: 02/25/2018 02:42    Procedures Procedures (including critical care time)  Medications Ordered in ED Medications - No data to display   Initial Impression / Assessment and Plan / ED Course  I have reviewed the triage vital signs and the nursing notes.  Pertinent labs & imaging results that were available during my care of the patient were reviewed by me and considered in my medical decision making (see chart  for details).     Overall well-appearing in the emergency department this time.  May have represented seizure.  Electrolytes within normal limits at this time.  Normal neurologic exam.  No indication for additional workup at this time.  Outpatient neurology follow-up.  Standard seizure precautions given.  Patient will need a primary care physician.  [facility]  Patient was recently released from jail.  He is requesting refills of his Remeron and his clonidine.  These have been prescribed.  Patient and family understand to return to the emergency department for new or worsening symptoms  Final Clinical Impressions(s) / ED Diagnoses   Final diagnoses:  Syncope, unspecified syncope type    ED Discharge Orders  Ordered    mirtazapine (REMERON) 45 MG tablet  Daily at bedtime     02/25/18 0803    cloNIDine (CATAPRES) 0.2 MG tablet  2 times daily     02/25/18 9604       Azalia Bilis, MD 02/25/18 [phone]
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
## 2018-09-23 ENCOUNTER — Other Ambulatory Visit: Payer: Self-pay

## 2018-09-23 ENCOUNTER — Encounter (HOSPITAL_COMMUNITY): Payer: Self-pay

## 2018-09-23 ENCOUNTER — Emergency Department (HOSPITAL_COMMUNITY)
Admission: EM | Admit: 2018-09-23 | Discharge: 2018-09-23 | Disposition: A | Discharge status: Home / Self Care | Payer: Self-pay | Attending: Emergency Medicine | Admitting: Emergency Medicine

## 2018-09-23 DIAGNOSIS — Z87891 Personal history of nicotine dependence: Secondary | ICD-10-CM | POA: Insufficient documentation

## 2018-09-23 DIAGNOSIS — Z91040 Latex allergy status: Secondary | ICD-10-CM | POA: Insufficient documentation

## 2018-09-23 DIAGNOSIS — Z79899 Other long term (current) drug therapy: Secondary | ICD-10-CM | POA: Insufficient documentation

## 2018-09-23 DIAGNOSIS — R369 Urethral discharge, unspecified: Secondary | ICD-10-CM | POA: Insufficient documentation

## 2018-09-23 LAB — URINALYSIS, ROUTINE W REFLEX MICROSCOPIC
Bilirubin Urine: NEGATIVE
Glucose, UA: NEGATIVE mg/dL
NITRITE: NEGATIVE
PROTEIN: 100 mg/dL — AB
Specific Gravity, Urine: 1.01 (ref 1.005–1.030)

## 2018-09-23 LAB — CBC WITH DIFFERENTIAL/PLATELET
Abs Immature Granulocytes: 0.06 10*3/uL (ref 0.00–0.07)
Basophils Absolute: 0 10*3/uL (ref 0.0–0.1)
Basophils Relative: 0 %
EOS ABS: 0.2 10*3/uL (ref 0.0–0.5)
EOS PCT: 1 %
HEMATOCRIT: 48 % (ref 39.0–52.0)
Hemoglobin: 15.8 g/dL (ref 13.0–17.0)
Immature Granulocytes: 0 %
LYMPHS ABS: 1.9 10*3/uL (ref 0.7–4.0)
Lymphocytes Relative: 13 %
MCH: 30 pg (ref 26.0–34.0)
MCHC: 32.9 g/dL (ref 30.0–36.0)
MCV: 91.3 fL (ref 80.0–100.0)
MONO ABS: 0.7 10*3/uL (ref 0.1–1.0)
MONOS PCT: 5 %
Neutro Abs: 11.8 10*3/uL — ABNORMAL HIGH (ref 1.7–7.7)
Neutrophils Relative %: 81 %
Platelets: 331 10*3/uL (ref 150–400)
RBC: 5.26 MIL/uL (ref 4.22–5.81)
RDW: 12.2 % (ref 11.5–15.5)
WBC: 14.6 10*3/uL — ABNORMAL HIGH (ref 4.0–10.5)
nRBC: 0 % (ref 0.0–0.2)

## 2018-09-23 LAB — URINALYSIS, MICROSCOPIC (REFLEX): Squamous Epithelial / LPF: NONE SEEN (ref 0–5)

## 2018-09-23 LAB — BASIC METABOLIC PANEL
ANION GAP: 8 (ref 5–15)
BUN: 8 mg/dL (ref 6–20)
CALCIUM: 8.9 mg/dL (ref 8.9–10.3)
CO2: 22 mmol/L (ref 22–32)
Chloride: 108 mmol/L (ref 98–111)
Creatinine, Ser: 0.97 mg/dL (ref 0.61–1.24)
Glucose, Bld: 138 mg/dL — ABNORMAL HIGH (ref 70–99)
Potassium: 3.9 mmol/L (ref 3.5–5.1)
SODIUM: 138 mmol/L (ref 135–145)

## 2018-09-23 LAB — I-STAT CG4 LACTIC ACID, ED: LACTIC ACID, VENOUS: 0.99 mmol/L (ref 0.5–1.9)

## 2018-09-23 MED ORDER — DOXYCYCLINE HYCLATE 100 MG PO CAPS: 100.0000 mg | ORAL_CAPSULE | Freq: Two times a day (BID) | ORAL | 0 refills | Status: DC

## 2018-09-23 MED ORDER — CEFTRIAXONE SODIUM 250 MG IJ SOLR
250.0000 mg | Freq: Once | INTRAMUSCULAR | Status: AC
  Administered 2018-09-23: 250 mg via INTRAMUSCULAR
  Filled 2018-09-23: qty 250

## 2018-09-23 MED ORDER — LIDOCAINE HCL (PF) 1 % IJ SOLN
INTRAMUSCULAR | Status: AC
  Administered 2018-09-23: 0.9 mL
  Filled 2018-09-23: qty 5

## 2018-09-23 NOTE — ED Provider Notes (Signed)
[facility] EMERGENCY DEPARTMENT Provider Note   CSN: [id number] Arrival date & time: 09/23/18  1412     History   Chief Complaint Chief Complaint  Patient presents with  . Dysuria    HPI Kyle Carney is a 22 y.o. male.  HPI   22 year old male presents today with complaints of penile discharge.  Patient notes 3 days of pustular drainage from his penis.  He notes pain with urination, he denies any abdominal pain nausea vomiting or fever.  Patient reports this is similar to previous STDs.  Patient reports he is sexually active with one male partner.   Past Medical History:  Diagnosis Date  . Bipolar 1 disorder (HCC)   . Depression   . Migraines   . PTSD (post-traumatic stress disorder)   . Stricture of urethra    suspected - urology appointment upcoming    Patient Active Problem List   Diagnosis Date Noted  . Mood disorder (HCC) 06/22/2015  . Migraine without aura, without mention of intractable migraine without mention of status migrainosus 03/04/2013  . Episodic tension type headache 03/04/2013  . Concussion with no loss of consciousness 03/04/2013    Past Surgical History:  Procedure Laterality Date  . APPENDECTOMY  2010  . APPENDECTOMY          Home Medications    Prior to Admission medications   Medication Sig Start Date End Date Taking? Authorizing Provider  cloNIDine (CATAPRES) 0.2 MG tablet Take 1 tablet (0.2 mg total) by mouth 2 (two) times daily. 02/25/18   Azalia Bilis, MD  doxycycline (VIBRAMYCIN) 100 MG capsule Take 1 capsule (100 mg total) by mouth 2 (two) times daily. 09/23/18   Munir Victorian, Tinnie Gens, PA-C  mirtazapine (REMERON) 45 MG tablet Take 1 tablet (45 mg total) by mouth at bedtime. 02/25/18   Azalia Bilis, MD    Family History History reviewed. No pertinent family history.  Social History Social History   Tobacco Use  . Smoking status: Former Games developer  . Smokeless tobacco: Never Used  Substance Use  Topics  . Alcohol use: No  . Drug use: Yes    Types: Marijuana    Comment: Used 3 weeks ago.      Allergies   Erythromycin and Latex   Review of Systems Review of Systems  All other systems reviewed and are negative.  Physical Exam Updated Vital Signs BP 138/86 (BP Location: Right Arm)   Pulse 82   Temp 99.3 F (37.4 C) (Oral)   Resp 18   SpO2 100%   Physical Exam  Constitutional: He is oriented to person, place, and time. He appears well-developed and well-nourished.  HENT:  Head: Normocephalic and atraumatic.  Eyes: Pupils are equal, round, and reactive to light. Conjunctivae are normal. Right eye exhibits no discharge. Left eye exhibits no discharge. No scleral icterus.  Neck: Normal range of motion. No JVD present. No tracheal deviation present.  Pulmonary/Chest: Effort normal. No stridor.  Abdominal:  Abdomen and pelvis soft nontender  Genitourinary:  Genitourinary Comments: Uncircumcised penis with purulent penile discharge  Neurological: He is alert and oriented to person, place, and time. Coordination normal.  Psychiatric: He has a normal mood and affect. His behavior is normal. Judgment and thought content normal.  Nursing note and vitals reviewed.   ED Treatments / Results  Labs (all labs ordered are listed, but only abnormal results are displayed) Labs Reviewed  CBC WITH DIFFERENTIAL/PLATELET - Abnormal; Notable for the following components:  Result Value   WBC 14.6 (*)    Neutro Abs 11.8 (*)    All other components within normal limits  URINALYSIS, ROUTINE W REFLEX MICROSCOPIC - Abnormal; Notable for the following components:   Color, Urine YELLOW (*)    APPearance CLOUDY (*)    pH >9.0 (*)    Hgb urine dipstick TRACE (*)    Ketones, ur TRACE (*)    Protein, ur 100 (*)    Leukocytes, UA LARGE (*)    All other components within normal limits  BASIC METABOLIC PANEL - Abnormal; Notable for the following components:   Glucose, Bld 138 (*)     All other components within normal limits  URINALYSIS, MICROSCOPIC (REFLEX) - Abnormal; Notable for the following components:   Bacteria, UA FEW (*)    All other components within normal limits  URINE CULTURE  I-STAT CG4 LACTIC ACID, ED  I-STAT CG4 LACTIC ACID, ED  GC/CHLAMYDIA PROBE AMP ([place]) NOT AT [facility]    EKG None  Radiology No results found.  Procedures Procedures (including critical care time)  Medications Ordered in ED Medications  cefTRIAXone (ROCEPHIN) injection 250 mg (250 mg Intramuscular Given 09/23/18 1618)  lidocaine (PF) (XYLOCAINE) 1 % injection (0.9 mLs  Given 09/23/18 1618)     Initial Impression / Assessment and Plan / ED Course  I have reviewed the triage vital signs and the nursing notes.  Pertinent labs & imaging results that were available during my care of the patient were reviewed by me and considered in my medical decision making (see chart for details).     22 year old male presents today with likely STD.  Patient treated prophylactically discharged with outpatient follow-up and strict return precautions.  He verbalized understanding and agreement to today's plan had no further questions or concerns at the time discharge.  Final Clinical Impressions(s) / ED Diagnoses   Final diagnoses:  Penile discharge    ED Discharge Orders         Ordered    doxycycline (VIBRAMYCIN) 100 MG capsule  2 times daily,   Status:  Discontinued     09/23/18 1612    doxycycline (VIBRAMYCIN) 100 MG capsule  2 times daily     09/23/18 1614           HedgesTinnie Gens, PA-C 09/23/18 1651    Pricilla Loveless, MD 09/23/18 2203

## 2018-09-23 NOTE — Discharge Instructions (Addendum)
Please read attached information. If you experience any new or worsening signs or symptoms please return to the emergency room for evaluation. Please follow-up with your primary care provider or specialist as discussed. Please use medication prescribed only as directed and discontinue taking if you have any concerning signs or symptoms.   °

## 2018-09-23 NOTE — ED Triage Notes (Signed)
Pt endorses burning with urination and green discharge x 2 days. Denies recent unprotected sex. Tachy.

## 2018-09-23 NOTE — ED Notes (Signed)
Patient given discharge instructions and verbalized understanding.  Patient stable to discharge at this time.  Patient is alert and oriented to baseline.  No distressed noted at this time.  All belongings taken with the patient at discharge.   

## 2018-09-23 NOTE — ED Provider Notes (Signed)
Patient placed in Quick Look pathway, seen and evaluated   Chief Complaint: Dysuria, scrotal pain  HPI:   Patient presents today for evaluation of 2 days of dysuria.  He reports green penile discharge for the past 2 days, denies any recent unprotected sex.  He reports that the middle of his lower abdomen hurts, denies any upper abdominal pain.  He states that it feels like someone is putting needles in the Of his penis when he pees, and the shaft of his penis hurts constantly.  He states that when he attempts to urinate he has mild scrotal pain, however this is only when he urinates.  No fevers.  ROS: No fevers.    Physical Exam:   Gen: No distress  Neuro: Awake and Alert  Skin: Warm    Focused Exam: Abd TTP over lower midline  Patient is mildly tachycardic.  Temp 99.3, he states that he is anxious with any doctor visits, however given slightly elevated temp orders for lactic acid and BMP along with CBC with differential placed.   Initiation of care has begun. The patient has been counseled on the process, plan, and necessity for staying for the completion/evaluation, and the remainder of the medical screening examination    Norman Clay 09/23/18 1431    Loren Racer, MD 09/29/18 [phone]

## 2018-09-24 LAB — URINE CULTURE: CULTURE: NO GROWTH

## 2018-09-24 LAB — GC/CHLAMYDIA PROBE AMP (~~LOC~~) NOT AT ARMC
Chlamydia: NEGATIVE
Neisseria Gonorrhea: POSITIVE — AB

## 2018-11-22 ENCOUNTER — Ambulatory Visit (HOSPITAL_COMMUNITY)
Admission: RE | Admit: 2018-11-22 | Discharge: 2018-11-22 | Disposition: A | Discharge status: Home / Self Care | Payer: Federal, State, Local not specified - Other | Attending: Psychiatry | Admitting: Psychiatry

## 2018-11-22 ENCOUNTER — Other Ambulatory Visit: Payer: Self-pay

## 2018-11-22 ENCOUNTER — Inpatient Hospital Stay (HOSPITAL_COMMUNITY)
Admission: RE | Admit: 2018-11-22 | Discharge: 2018-11-25 | DRG: 885 | Disposition: A | Discharge status: Home / Self Care | Payer: Federal, State, Local not specified - Other | Attending: Psychiatry | Admitting: Psychiatry

## 2018-11-22 ENCOUNTER — Encounter (HOSPITAL_COMMUNITY): Payer: Self-pay

## 2018-11-22 ENCOUNTER — Encounter (HOSPITAL_COMMUNITY): Payer: Self-pay | Admitting: Behavioral Health

## 2018-11-22 DIAGNOSIS — F333 Major depressive disorder, recurrent, severe with psychotic symptoms: Principal | ICD-10-CM | POA: Diagnosis present

## 2018-11-22 DIAGNOSIS — Z91410 Personal history of adult physical and sexual abuse: Secondary | ICD-10-CM

## 2018-11-22 DIAGNOSIS — Z87891 Personal history of nicotine dependence: Secondary | ICD-10-CM

## 2018-11-22 DIAGNOSIS — F4310 Post-traumatic stress disorder, unspecified: Secondary | ICD-10-CM | POA: Diagnosis present

## 2018-11-22 DIAGNOSIS — R45851 Suicidal ideations: Secondary | ICD-10-CM | POA: Diagnosis present

## 2018-11-22 DIAGNOSIS — G4700 Insomnia, unspecified: Secondary | ICD-10-CM | POA: Diagnosis present

## 2018-11-22 DIAGNOSIS — Z91411 Personal history of adult psychological abuse: Secondary | ICD-10-CM | POA: Diagnosis not present

## 2018-11-22 MED ORDER — TRAZODONE HCL 50 MG PO TABS
50.0000 mg | ORAL_TABLET | Freq: Every evening | ORAL | Status: DC | PRN
  Administered 2018-11-22 – 2018-11-24 (×3): 50 mg via ORAL
  Filled 2018-11-22 (×3): qty 1
  Filled 2018-11-22: qty 14
  Filled 2018-11-22 (×4): qty 1
  Filled 2018-11-22: qty 14
  Filled 2018-11-22 (×5): qty 1

## 2018-11-22 MED ORDER — ALUM & MAG HYDROXIDE-SIMETH 200-200-20 MG/5ML PO SUSP: 30.0000 mL | ORAL | Status: DC | PRN

## 2018-11-22 MED ORDER — ACETAMINOPHEN 325 MG PO TABS
650.0000 mg | ORAL_TABLET | Freq: Four times a day (QID) | ORAL | Status: DC | PRN
  Administered 2018-11-24: 650 mg via ORAL
  Filled 2018-11-22: qty 2

## 2018-11-22 MED ORDER — HYDROXYZINE HCL 25 MG PO TABS
25.0000 mg | ORAL_TABLET | Freq: Three times a day (TID) | ORAL | Status: DC | PRN
  Administered 2018-11-22: 25 mg via ORAL
  Filled 2018-11-22: qty 1
  Filled 2018-11-22: qty 10

## 2018-11-22 MED ORDER — MAGNESIUM HYDROXIDE 400 MG/5ML PO SUSP: 30.0000 mL | Freq: Every day | ORAL | Status: DC | PRN

## 2018-11-22 MED ORDER — ARIPIPRAZOLE 5 MG PO TABS
5.0000 mg | ORAL_TABLET | Freq: Every day | ORAL | Status: DC
  Administered 2018-11-23 – 2018-11-25 (×3): 5 mg via ORAL
  Filled 2018-11-22 (×5): qty 1
  Filled 2018-11-22: qty 7
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
## 2018-11-22 NOTE — Progress Notes (Signed)
Patient did attend the evening speaker AA meeting.  

## 2018-11-22 NOTE — Tx Team (Signed)
Initial Treatment Plan 11/22/2018 7:28 PM Kyle Carney [id number]    PATIENT STRESSORS: Financial difficulties Medication change or noncompliance Other: Mental health   PATIENT STRENGTHS: Average or above average intelligence Communication skills Motivation for treatment/growth Physical Health   PATIENT IDENTIFIED PROBLEMS:   "work on my depression and anger"    " finding my purpose"    " I get mad really fast"           DISCHARGE CRITERIA:  Improved stabilization in mood, thinking, and/or behavior Need for constant or close observation no longer present Reduction of life-threatening or endangering symptoms to within safe limits Verbal commitment to aftercare and medication compliance  PRELIMINARY DISCHARGE PLAN: Outpatient therapy Return to previous living arrangement  PATIENT/FAMILY INVOLVEMENT: This treatment plan has been presented to and reviewed with the patient, Kyle Carney,.  The patient has been given the opportunity to ask questions and make suggestions.  Shela NevinValerie S Antonie Borjon, RN 11/22/2018, 7:28 PM

## 2018-11-22 NOTE — Progress Notes (Signed)
Writer spoke with patient 1:1 and informed him of medications available if needed. He reported that he would like to take visteril which he received. He reports that he was admitted here when he was ateen. He talked about his jail time and how he needs to get his life together because he has a baby due in April. He is charging his monitor in dayroom and will return to nursing station when done. Safety maintained on unit with 15 min checks.

## 2018-11-22 NOTE — Progress Notes (Signed)
Patient signed 72 hr request for discharge at 7pm
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
## 2018-11-22 NOTE — H&P (Signed)
Behavioral Health Medical Screening Exam  [facility] is an 22 y.o. male.  Patient.  Since has a behavioral health walking with reports of suicidal ideations with plan and intent.  Patient reports a history of PTSD, bipolar, depression, schizoaffective disorder.  Patient reports he has been off his medication for quite some time.  Reports he is currently on house arrest due to possession of a firearm which he reports is a  Radiation protection practitionerViolation of his parole.  Admitted to the unit will restart home medications where appropriate support encouragement reassurance was provided.  Total Time spent with patient: 15 minutes  Psychiatric Specialty Exam: Physical Exam  Vitals reviewed. Constitutional: He is oriented to person, place, and time. He appears well-developed.  Neurological: He is alert and oriented to person, place, and time.  Psychiatric: He has a normal mood and affect. His behavior is normal.    Review of Systems  Psychiatric/Behavioral: Positive for depression, hallucinations, substance abuse and suicidal ideas. The patient is nervous/anxious and has insomnia.   All other systems reviewed and are negative.   There were no vitals taken for this visit.There is no height or weight on file to calculate BMI.  General Appearance: Guarded  Eye Contact:  Fair  Speech:  Clear and Coherent  Volume:  Normal  Mood:  Depressed  Affect:  Depressed and Flat  Thought Process:  Linear  Orientation:  Full (Time, Place, and Person)  Thought Content:  Hallucinations: Auditory and Paranoid Ideation  Suicidal Thoughts:  Yes.  with intent/plan  Homicidal Thoughts:  No  Memory:  Immediate;   Fair Recent;   Fair Remote;   Fair  Judgement:  Fair  Insight:  Lacking  Psychomotor Activity:  Normal  Concentration: Concentration: Fair  Recall:  [employer]Fair  Fund of Knowledge:Fair  Language: Fair  Akathisia:  No  Handed:  Right  AIMS (if indicated):     Assets:  Communication Skills Desire for  Improvement Financial Resources/Insurance Social Support  Sleep:       Musculoskeletal: Strength & Muscle Tone: within normal limits Gait & Station: normal Patient leans: N/A  There were no vitals taken for this visit.  Recommendations: B/P 131/70 HR 89  Temp 99 O2 sat 99%  Based on my evaluation the patient does not appear to have an emergency medical condition. - inpatient admission recommended.     Oneta Rackanika N Ellsie Violette, NP 11/22/2018, 5:51 PM

## 2018-11-22 NOTE — Plan of Care (Signed)
  Problem: Education: Goal: Knowledge of [facility] Education information/materials will improve Outcome: Progressing Goal: Verbalization of understanding the information provided will improve Outcome: Progressing   

## 2018-11-23 DIAGNOSIS — F333 Major depressive disorder, recurrent, severe with psychotic symptoms: Principal | ICD-10-CM

## 2018-11-23 LAB — COMPREHENSIVE METABOLIC PANEL
ALK PHOS: 83 U/L (ref 38–126)
ALT: 28 U/L (ref 0–44)
AST: 21 U/L (ref 15–41)
Albumin: 4.3 g/dL (ref 3.5–5.0)
Anion gap: 8 (ref 5–15)
BILIRUBIN TOTAL: 0.3 mg/dL (ref 0.3–1.2)
BUN: 12 mg/dL (ref 6–20)
CO2: 26 mmol/L (ref 22–32)
CREATININE: 0.96 mg/dL (ref 0.61–1.24)
Calcium: 9.3 mg/dL (ref 8.9–10.3)
Chloride: 107 mmol/L (ref 98–111)
GFR calc Af Amer: >60 mL/min (ref >60)
Glucose, Bld: 93 mg/dL (ref 70–99)
Potassium: 3.9 mmol/L (ref 3.5–5.1)
Sodium: 141 mmol/L (ref 135–145)
TOTAL PROTEIN: 7.5 g/dL (ref 6.5–8.1)

## 2018-11-23 LAB — RAPID URINE DRUG SCREEN, HOSP PERFORMED
Amphetamines: NOT DETECTED
Barbiturates: NOT DETECTED
Benzodiazepines: NOT DETECTED
Cocaine: NOT DETECTED
Opiates: NOT DETECTED
Tetrahydrocannabinol: POSITIVE — AB

## 2018-11-23 LAB — LIPID PANEL
Cholesterol: 110 mg/dL (ref 0–200)
HDL: 34 mg/dL — AB (ref >40)
LDL CALC: 59 mg/dL (ref 0–99)
Total CHOL/HDL Ratio: 3.2 RATIO
Triglycerides: 83 mg/dL (ref <150)
VLDL: 17 mg/dL (ref 0–40)

## 2018-11-23 LAB — CBC
HEMATOCRIT: 47.1 % (ref 39.0–52.0)
Hemoglobin: 15.2 g/dL (ref 13.0–17.0)
MCH: 31.2 pg (ref 26.0–34.0)
MCHC: 32.3 g/dL (ref 30.0–36.0)
MCV: 96.7 fL (ref 80.0–100.0)
NRBC: 0 % (ref 0.0–0.2)
Platelets: 249 10*3/uL (ref 150–400)
RBC: 4.87 MIL/uL (ref 4.22–5.81)
RDW: 12.6 % (ref 11.5–15.5)
WBC: 6.2 10*3/uL (ref 4.0–10.5)

## 2018-11-23 LAB — HEMOGLOBIN A1C
HEMOGLOBIN A1C: 4.7 % — AB (ref 4.8–5.6)
Mean Plasma Glucose: 88.19 mg/dL

## 2018-11-23 LAB — TSH: TSH: 1.96 u[IU]/mL (ref 0.350–4.500)

## 2018-11-23 MED ORDER — NICOTINE POLACRILEX 2 MG MT GUM: 2.0000 mg | CHEWING_GUM | OROMUCOSAL | Status: DC | PRN

## 2018-11-23 NOTE — Progress Notes (Signed)
Adult Psychoeducational Group Note  Date:  11/23/2018 Time:  8:56 PM  Group Topic/Focus:  Wrap-Up Group:   The focus of this group is to help patients review their daily goal of treatment and discuss progress on daily workbooks.  Participation Level:  Active  Participation Quality:  Appropriate  Affect:  Appropriate  Cognitive:  Appropriate  Insight: Appropriate  Engagement in Group:  Engaged  Modes of Intervention:  Discussion  Additional Comments:  Pt stated goal was to find coping skills for depression and anxiety.  Pt stated goal was met.  Pt rated the day at a 8/10.  Adriana Quinby 11/23/2018, 8:56 PM

## 2018-11-23 NOTE — Progress Notes (Signed)
DAR NOTE: Patient presents with flat affect and depressed mood.  Denies suicidal thoughts, pain, auditory and visual hallucinations.  Described energy level as low and concentration as poor.  Rates depression at 5, hopelessness at 5, and anxiety at 0.  Maintained on routine safety checks.  Medications given as prescribed.  Support and encouragement offered as needed.  Attended group and participated. Patient visible in milieu with minimal interactions.  Patient is safe on and off the unit.  Offered no complaint.

## 2018-11-23 NOTE — Progress Notes (Signed)
Recreation Therapy Notes  Date: 12.16.19 Time: 0930 Location: [address] Dayroom  Group Topic: Stress Management  Goal Area(s) Addresses:  Patient will verbalize importance of using healthy stress management.  Patient will identify positive emotions associated with healthy stress management.   Intervention: Stress Management  Activity :  Meditation.  LRT introduced the stress management technique of meditation.  LRT played a meditation that focused on getting your mind prepared to face the day.  Patients were to listen and follow along as meditation played.  Education:  Stress Management, Discharge Planning.   Education Outcome: Acknowledges edcuation/In group clarification offered/Needs additional education  Clinical Observations/Feedback: Pt did not attend group.     Kenley Troop, LRT/CTRS         Layman Gully A 11/23/2018 11:31 AM 

## 2018-11-23 NOTE — [facility] Suicide Risk Assessment (Signed)
[facility] INPATIENT:  Family/Significant Other Suicide Prevention Education  Suicide Prevention Education:  Education Completed; Ashley MurrainWenonaha Macebo-Confer (pt's [phone]) has been identified by the patient as the family member/significant other with whom the patient will be residing, and identified as the person(s) who will aid the patient in the event of a mental health crisis (suicidal ideations/suicide attempt).  With written consent from the patient, the family member/significant other has been provided the following suicide prevention education, prior to the and/or following the discharge of the patient.  The suicide prevention education provided includes the following:  Suicide risk factors  Suicide prevention and interventions  National Suicide Hotline telephone number  [facility] assessment telephone number  [facility] 911  [facility] and/or [facility] telephone number  Request made of family/significant other to:  Remove weapons (e.g., guns, rifles, knives), all items previously/currently identified as safety concern.    Remove drugs/medications (over-the-counter, prescriptions, illicit drugs), all items previously/currently identified as a safety concern.  The family member/significant other verbalizes understanding of the suicide prevention education information provided.  The family member/significant other agrees to remove the items of safety concern listed above.  Rona RavensHeather S Khamani Fairley LCSW 11/23/2018, 2:47 PM
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
## 2018-11-23 NOTE — Tx Team (Signed)
Interdisciplinary Treatment and Diagnostic Plan Update  11/23/2018 Time of Session: 0830AM [name] MRN: [id number]  Principal Diagnosis: MDD, recurrent, severe, with psychosis  Secondary Diagnoses: Active Problems:   MDD (major depressive disorder), recurrent, severe, with psychosis ([place])   Current Medications:  Current Facility-Administered Medications  Medication Dose Route Frequency Provider Last Rate Last Dose  . acetaminophen (TYLENOL) tablet 650 mg  650 mg Oral Q6H PRN Derrill Center, NP      . alum & mag hydroxide-simeth (MAALOX/MYLANTA) 200-200-20 MG/5ML suspension 30 mL  30 mL Oral Q4H PRN Derrill Center, NP      . ARIPiprazole (ABILIFY) tablet 5 mg  5 mg Oral Daily Derrill Center, NP   5 mg at 11/23/18 0837  . hydrOXYzine (ATARAX/VISTARIL) tablet 25 mg  25 mg Oral TID PRN Derrill Center, NP   25 mg at 11/22/18 2125  . magnesium hydroxide (MILK OF MAGNESIA) suspension 30 mL  30 mL Oral Daily PRN Derrill Center, NP      . traZODone (DESYREL) tablet 50 mg  50 mg Oral QHS,MR X 1 Derrill Center, NP   50 mg at 11/22/18 2247   PTA Medications: Medications Prior to Admission  Medication Sig Dispense Refill Last Dose  . citalopram (CELEXA) 10 MG tablet Take 10 mg by mouth daily.   Past Week at Unknown time  . cloNIDine (CATAPRES) 0.2 MG tablet Take 1 tablet (0.2 mg total) by mouth 2 (two) times daily. 60 tablet 0     Patient Stressors: Financial difficulties Medication change or noncompliance Other: Mental health  Patient Strengths: Average or above average intelligence Communication skills Motivation for treatment/growth Physical Health  Treatment Modalities: Medication Management, Group therapy, Case management,  1 to 1 session with clinician, Psychoeducation, Recreational therapy.   Physician Treatment Plan for Primary Diagnosis: MDD, recurrent, severe, with psychosis  Medication Management: Evaluate patient's response, side effects, and tolerance  of medication regimen.  Therapeutic Interventions: 1 to 1 sessions, Unit Group sessions and Medication administration.  Evaluation of Outcomes: Not Met  Physician Treatment Plan for Secondary Diagnosis: Active Problems:   MDD (major depressive disorder), recurrent, severe, with psychosis ([place])   Medication Management: Evaluate patient's response, side effects, and tolerance of medication regimen.  Therapeutic Interventions: 1 to 1 sessions, Unit Group sessions and Medication administration.  Evaluation of Outcomes: Not Met   RN Treatment Plan for Primary Diagnosis: MDD, recurrent, severe, with psychosis Long Term Goal(s): Knowledge of disease and therapeutic regimen to maintain health will improve  Short Term Goals: Ability to remain free from injury will improve, Ability to disclose and discuss suicidal ideas and Ability to identify and develop effective coping behaviors will improve  Medication Management: RN will administer medications as ordered by provider, will assess and evaluate patient's response and provide education to patient for prescribed medication. RN will report any adverse and/or side effects to prescribing provider.  Therapeutic Interventions: 1 on 1 counseling sessions, Psychoeducation, Medication administration, Evaluate responses to treatment, Monitor vital signs and CBGs as ordered, Perform/monitor CIWA, COWS, AIMS and Fall Risk screenings as ordered, Perform wound care treatments as ordered.  Evaluation of Outcomes: Not Met   LCSW Treatment Plan for Primary Diagnosis:MDD, recurrent, severe, with psychosis Long Term Goal(s): Safe transition to appropriate next level of care at discharge, Engage patient in therapeutic group addressing interpersonal concerns.  Short Term Goals: Engage patient in aftercare planning with referrals and resources, Facilitate patient progression through stages of change regarding substance use  diagnoses and concerns and Identify  triggers associated with mental health/substance abuse issues  Therapeutic Interventions: Assess for all discharge needs, 1 to 1 time with Social worker, Explore available resources and support systems, Assess for adequacy in community support network, Educate family and significant other(s) on suicide prevention, Complete Psychosocial Assessment, Interpersonal group therapy.  Evaluation of Outcomes: Not Met   Progress in Treatment: Attending groups: No. New to unit. Continuing to assess.  Participating in groups: No. Taking medication as prescribed: Yes. Toleration medication: Yes. Family/Significant other contact made: No, will contact:  family member if pt consents to collateral contact. [facility] pamphlet and mobile crisis information provided.  Patient understands diagnosis: Yes. Discussing patient identified problems/goals with staff: Yes. Medical problems stabilized or resolved: Yes. Denies suicidal/homicidal ideation: Yes Issues/concerns per patient self-inventory: No. Other: n/a   New problem(s) identified: No, Describe:  n/a  New Short Term/Long Term Goal(s): detox, medication management for mood stabilization; elimination of SI thoughts; development of comprehensive mental wellness/sobriety plan.   Patient Goals:  "To work on my anger and to find my purpose in life."   Discharge Plan or Barriers: CSW assessing for appropriate referrals. [place] pamphlet, Mobile Crisis information, and AA/NA information provided to patient for additional community support and resources.   Reason for Continuation of Hospitalization: Anxiety Depression Medication stabilization Suicidal ideation Withdrawal symptoms  Estimated Length of Stay: Thursday, 11/26/18  Attendees: Patient: Kyle Carney 11/23/2018 9:27 AM  Physician: Dr. Jake Samples MD 11/23/2018 9:27 AM  Nursing: Benjamine Mola RN 11/23/2018 9:27 AM  RN Care Manager:x 11/23/2018 9:27 AM  Social Worker: Janice Norrie LCSW 11/23/2018  9:27 AM  Recreational Therapist: x 11/23/2018 9:27 AM  Other: Ricky Ala NP 11/23/2018 9:27 AM  Other:  11/23/2018 9:27 AM  Other: 11/23/2018 9:27 AM   Scribe for Treatment Team: Avelina Laine, LCSW 11/23/2018 9:27 AM

## 2018-11-24 LAB — PROLACTIN: Prolactin: 23.6 ng/mL — ABNORMAL HIGH (ref 4.0–15.2)

## 2018-11-24 MED ORDER — ARIPIPRAZOLE ER 400 MG IM SRER
400.0000 mg | Freq: Once | INTRAMUSCULAR | Status: AC
  Administered 2018-11-24: 400 mg via INTRAMUSCULAR

## 2018-11-24 MED ORDER — CARBAMAZEPINE 100 MG PO CHEW
100.0000 mg | CHEWABLE_TABLET | Freq: Three times a day (TID) | ORAL | Status: DC
  Administered 2018-11-24 – 2018-11-25 (×4): 100 mg via ORAL
  Filled 2018-11-24 (×2): qty 1
  Filled 2018-11-24: qty 21
  Filled 2018-11-24 (×2): qty 1
  Filled 2018-11-24 (×2): qty 21
  Filled 2018-11-24 (×2): qty 1

## 2018-11-24 MED ORDER — NICOTINE 21 MG/24HR TD PT24
21.0000 mg | MEDICATED_PATCH | Freq: Every day | TRANSDERMAL | Status: DC
  Administered 2018-11-24 – 2018-11-25 (×2): 21 mg via TRANSDERMAL
  Filled 2018-11-24 (×4): qty 1

## 2018-11-24 NOTE — Progress Notes (Signed)
[facility] MD Progress Note  11/24/2018 10:29 AM Kyle Carney  MRN:  [id number] Subjective:    This 22 year old patient has an extensive psychiatric history dating back to age 31 with he was first hospitalized, reports chronic depression and suicidality on presentation however endorsed more schizoaffective type symptoms including auditory hallucinations, insomnia, anger irritability and outbursts, and of course daily cannabis dependency which may be triggering the majority of his symptoms.  Family has concerns about safety understandably.  He is already on house arrest.  Pacing numerous charges including assault on male resisting an officer in possession of a firearm by a felon and misdemeanor larceny.  Despite this presentation he states he is better now and wants to leave he is focused on discharge and minimizing his symptoms.  He denies wanting to harm self or others denies suicidal thoughts homicidal thoughts denies hallucinations again is focused on discharge no EPS or TD no seizure activity or involuntary movements  Principal Problem: MDD (major depressive disorder), recurrent, severe, with psychosis (HCC) Diagnosis: Principal Problem:   MDD (major depressive disorder), recurrent, severe, with psychosis (HCC)  Total Time spent with patient: 20 minutes   Past Medical History:  Past Medical History:  Diagnosis Date  . Bipolar 1 disorder (HCC)   . Depression   . Migraines   . PTSD (post-traumatic stress disorder)   . Stricture of urethra    suspected - urology appointment upcoming    Past Surgical History:  Procedure Laterality Date  . APPENDECTOMY  2010  . APPENDECTOMY     Family History: History reviewed. No pertinent family history.  Social History:  Social History   Substance and Sexual Activity  Alcohol Use No     Social History   Substance and Sexual Activity  Drug Use Yes  . Frequency: 7.0 times per week  . Types: Marijuana   Comment: Daily use     Social History   Socioeconomic History  . Marital status: Single    Spouse name: Not on file  . Number of children: 1  . Years of education: Not on file  . Highest education level: Not on file  Occupational History  . Occupation: Unemployed  Social Needs  . Financial resource strain: Not on file  . Food insecurity:    Worry: Not on file    Inability: Not on file  . Transportation needs:    Medical: Not on file    Non-medical: Not on file  Tobacco Use  . Smoking status: Former Games developer  . Smokeless tobacco: Never Used  Substance and Sexual Activity  . Alcohol use: No  . Drug use: Yes    Frequency: 7.0 times per week    Types: Marijuana    Comment: Daily use  . Sexual activity: Yes  Lifestyle  . Physical activity:    Days per week: Not on file    Minutes per session: Not on file  . Stress: Not on file  Relationships  . Social connections:    Talks on phone: Not on file    Gets together: Not on file    Attends religious service: Not on file    Active member of club or organization: Not on file    Attends meetings of clubs or organizations: Not on file    Relationship status: Not on file  Other Topics Concern  . Not on file  Social History Narrative   ** Merged History Encounter **    Pt lives with mother; has a child.  Additional Social History:                         Sleep: Fair  Appetite:  Good  Current Medications: Current Facility-Administered Medications  Medication Dose Route Frequency Provider Last Rate Last Dose  . acetaminophen (TYLENOL) tablet 650 mg  650 mg Oral Q6H PRN Oneta RackLewis, Tanika N, NP      . alum & mag hydroxide-simeth (MAALOX/MYLANTA) 200-200-20 MG/5ML suspension 30 mL  30 mL Oral Q4H PRN Oneta RackLewis, Tanika N, NP      . ARIPiprazole (ABILIFY) tablet 5 mg  5 mg Oral Daily Oneta RackLewis, Tanika N, NP   5 mg at 11/24/18 0815  . ARIPiprazole ER (ABILIFY MAINTENA) injection 400 mg  400 mg Intramuscular Once Malvin JohnsFarah, Shaneta Cervenka, MD      . carbamazepine  (TEGRETOL) chewable tablet 100 mg  100 mg Oral TID Malvin JohnsFarah, Kazuto Sevey, MD      . hydrOXYzine (ATARAX/VISTARIL) tablet 25 mg  25 mg Oral TID PRN Oneta RackLewis, Tanika N, NP   25 mg at 11/22/18 2125  . magnesium hydroxide (MILK OF MAGNESIA) suspension 30 mL  30 mL Oral Daily PRN Oneta RackLewis, Tanika N, NP      . nicotine polacrilex (NICORETTE) gum 2 mg  2 mg Oral PRN Malvin JohnsFarah, Akeria Hedstrom, MD      . traZODone (DESYREL) tablet 50 mg  50 mg Oral QHS,MR X 1 Oneta RackLewis, Tanika N, NP   50 mg at 11/23/18 2140    Lab Results:  Results for orders placed or performed during the hospital encounter of 11/22/18 (from the past 48 hour(s))  CBC     Status: None   Collection Time: 11/23/18  6:33 AM  Result Value Ref Range   WBC 6.2 4.0 - 10.5 K/uL   RBC 4.87 4.22 - 5.81 MIL/uL   Hemoglobin 15.2 13.0 - 17.0 g/dL   HCT 16.147.1 09.639.0 - 04.552.0 %   MCV 96.7 80.0 - 100.0 fL   MCH 31.2 26.0 - 34.0 pg   MCHC 32.3 30.0 - 36.0 g/dL   RDW 40.912.6 81.111.5 - 91.415.5 %   Platelets 249 150 - 400 K/uL   nRBC 0.0 0.0 - 0.2 %    Comment: Performed at [facility], [address] [place], [place] [address]  Comprehensive metabolic panel     Status: None   Collection Time: 11/23/18  6:33 AM  Result Value Ref Range   Sodium 141 135 - 145 mmol/L   Potassium 3.9 3.5 - 5.1 mmol/L   Chloride 107 98 - 111 mmol/L   CO2 26 22 - 32 mmol/L   Glucose, Bld 93 70 - 99 mg/dL   BUN 12 6 - 20 mg/dL   Creatinine, Ser 6.210.96 0.61 - 1.24 mg/dL   Calcium 9.3 8.9 - 30.810.3 mg/dL   Total Protein 7.5 6.5 - 8.1 g/dL   Albumin 4.3 3.5 - 5.0 g/dL   AST 21 15 - 41 U/L   ALT 28 0 - 44 U/L   Alkaline Phosphatase 83 38 - 126 U/L   Total Bilirubin 0.3 0.3 - 1.2 mg/dL   GFR calc non Af Amer >60 >60 mL/min   GFR calc Af Amer >60 >60 mL/min   Anion gap 8 5 - 15    Comment: Performed at [facility], [address] [place], [place] [address]  Hemoglobin A1c     Status: Abnormal   Collection Time: 11/23/18  6:33 AM  Result Value Ref  Range   Hgb A1c MFr Bld  4.7 (L) 4.8 - 5.6 %    Comment: (NOTE) Pre diabetes:          5.7%-6.4% Diabetes:              >6.4% Glycemic control for   <7.0% adults with diabetes    Mean Plasma Glucose 88.19 mg/dL    Comment: Performed at [facility], [address] [place], [place] [address]  Lipid panel     Status: Abnormal   Collection Time: 11/23/18  6:33 AM  Result Value Ref Range   Cholesterol 110 0 - 200 mg/dL   Triglycerides 83 <604 mg/dL   HDL 34 (L) >54 mg/dL   Total CHOL/HDL Ratio 3.2 RATIO   VLDL 17 0 - 40 mg/dL   LDL Cholesterol 59 0 - 99 mg/dL    Comment:        Total Cholesterol/HDL:CHD Risk Coronary Heart Disease Risk Table                     Men   Women  1/2 Average Risk   3.4   3.3  Average Risk       5.0   4.4  2 X Average Risk   9.6   7.1  3 X Average Risk  23.4   11.0        Use the calculated Patient Ratio above and the CHD Risk Table to determine the patient's CHD Risk.        ATP III CLASSIFICATION (LDL):  <100     mg/dL   Optimal  098-119  mg/dL   Near or Above                    Optimal  130-159  mg/dL   Borderline  147-829  mg/dL   High  >562     mg/dL   Very High Performed at [facility], [address], [place], [place] [address]   TSH     Status: None   Collection Time: 11/23/18  6:33 AM  Result Value Ref Range   TSH 1.960 0.350 - 4.500 uIU/mL    Comment: Performed by a 3rd Generation assay with a functional sensitivity of <=0.01 uIU/mL. Performed at [facility], [address], [place], [place] [address]   Prolactin     Status: Abnormal   Collection Time: 11/23/18  6:33 AM  Result Value Ref Range   Prolactin 23.6 (H) 4.0 - 15.2 ng/mL    Comment: (NOTE) Performed At: [facility] [address] [place], [place] [id number] Jolene Schimke MD LK:[id number]   Urine rapid drug screen (hosp performed)     Status: Abnormal   Collection Time: 11/23/18 11:32 AM  Result Value Ref Range   Opiates NONE DETECTED  NONE DETECTED   Cocaine NONE DETECTED NONE DETECTED   Benzodiazepines NONE DETECTED NONE DETECTED   Amphetamines NONE DETECTED NONE DETECTED   Tetrahydrocannabinol POSITIVE (A) NONE DETECTED   Barbiturates NONE DETECTED NONE DETECTED    Comment: (NOTE) DRUG SCREEN FOR MEDICAL PURPOSES ONLY.  IF CONFIRMATION IS NEEDED FOR ANY PURPOSE, NOTIFY LAB WITHIN 5 DAYS. LOWEST DETECTABLE LIMITS FOR URINE DRUG SCREEN Drug Class                     Cutoff (ng/mL) Amphetamine and metabolites    1000 Barbiturate and metabolites    200 Benzodiazepine  200 Tricyclics and metabolites     300 Opiates and metabolites        300 Cocaine and metabolites        300 THC                            50 Performed at [facility], [address] [place], [place] [address]     Blood Alcohol level:  Lab Results  Component Value Date   [facility] <5 06/21/2015   ETH <11 07/06/2014    Metabolic Disorder Labs: Lab Results  Component Value Date   HGBA1C 4.7 (L) 11/23/2018   MPG 88.19 11/23/2018   Lab Results  Component Value Date   PROLACTIN 23.6 (H) 11/23/2018   Lab Results  Component Value Date   CHOL 110 11/23/2018   TRIG 83 11/23/2018   HDL 34 (L) 11/23/2018   CHOLHDL 3.2 11/23/2018   VLDL 17 11/23/2018   LDLCALC 59 11/23/2018   LDLCALC  05/10/2008    55        Total Cholesterol/HDL:CHD Risk Coronary Heart Disease Risk Table                     Men   Women  1/2 Average Risk   3.4   3.3    Physical Findings: AIMS:  , ,  ,  ,    CIWA:    COWS:     Musculoskeletal: Strength & Muscle Tone: within normal limits Gait & Station: normal   Psychiatric Specialty Exam: Physical Exam  ROS  Blood pressure 134/68, pulse (!) 102, temperature 98.2 F (36.8 C), temperature source Oral, resp. rate 20, height 5\' 7"  (1.702 m), weight 83.5 kg, SpO2 100 %.Body mass index is 28.82 kg/m.  General Appearance: Casual  Eye Contact:  Good  Speech:  Clear and Coherent   Volume:  Increased  Mood:  Anxious and Dysphoric  Affect:  Full Range  Thought Process:  Goal Directed  Orientation:  Full (Time, Place, and Person)  Thought Content:  Tangential  Suicidal Thoughts:  No  Homicidal Thoughts:  No  Memory:  Immediate;   Fair  Judgement:  Fair  Insight:  Fair  Psychomotor Activity:  Normal  Concentration:  Concentration: Good  Recall:  Good  Fund of Knowledge:  Good  Language:  Negative for issues  Akathisia:  Negative  Handed:  Right  AIMS (if indicated):     Assets:  Psychologist, counselling Resources/Insurance  ADL's:  Intact  Cognition:  WNL  Sleep:  Number of Hours: 6.5   For instability of mood add mood stabilizer for psychosis add long-acting aripiprazole for substance abuse monitor for withdrawal To seek longer-term treatment and sobriety   Treatment Plan Summary: Daily contact with patient to assess and evaluate symptoms and progress in treatment and Medication management  Leshia Kope, MD 11/24/2018, 10:29 AM

## 2018-11-24 NOTE — Progress Notes (Signed)
Recreation Therapy Notes  Animal-Assisted Activity (AAA) Program Checklist/Progress Notes Patient Eligibility Criteria Checklist & Daily Group note for Rec Tx Intervention  Date: 12.17.19 Time: 1430 [address]   AAA/T Program Assumption of Risk Form signed by Engineer, productionatient/ or Parent Legal Guardian  YES  Patient is free of allergies or sever asthma  YES   Patient reports no fear of animals  YES   Patient reports no history of cruelty to animals YES   Patient understands his/her participation is voluntary  YES   Patient washes hands before animal contact YES  Patient washes hands after animal contact  YES   Behavioral Response: Engaged  Education: Charity fundraiserHand Washing, Appropriate Animal Interaction   Education Outcome: Acknowledges understanding/In group clarification offered/Needs additional education.   Clinical Observations/Feedback: Pt attended group activity.    Kyle Carney, Kyle Carney         Kyle Carney, Kyle Carney A 11/24/2018 2:59 PM

## 2018-11-24 NOTE — [facility] Group Notes (Signed)
Pt was invited but did not attend orientation/goals group facilitated by MHT Lequisha C.  

## 2018-11-24 NOTE — Progress Notes (Signed)
Adult Psychoeducational Group Note  Date:  11/24/2018 Time:  9:38 PM  Group Topic/Focus:  Wrap-Up Group:   The focus of this group is to help patients review their daily goal of treatment and discuss progress on daily workbooks.  Participation Level:  Active  Participation Quality:  Appropriate  Affect:  Appropriate  Cognitive:  Appropriate  Insight: Appropriate  Engagement in Group:  Engaged  Modes of Intervention:  Discussion  Additional Comments:  Pt goal was to meditate when anxiety rises.  Pt stated goal was met.  Pt rated the day at a 8/10.  Cynthia Stainback 11/24/2018, 9:38 PM

## 2018-11-24 NOTE — [facility] Group Notes (Signed)
[facility] 11/24/2018 1:15pm  Type of Therapy: Mental Health Association Presentation  Participation Level: Active  Participation Quality: Attentive  Affect: Appropriate  Cognitive: Oriented  Insight: Developing/Improving  Engagement in Therapy: Engaged  Modes of Intervention: Discussion, Education and Socialization  Summary of Progress/Problems: Mental Health Association (MHA) Speaker came to talk about his personal journey with mental health. The pt processed ways by which to relate to the speaker. MHA speaker provided handouts and educational information pertaining to groups and services offered by the [facility]. Pt was engaged in speaker's presentation and was receptive to resources provided.    Rona RavensHeather S Merrily Tegeler, LCSW 11/24/2018 2:23 PM
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
## 2018-11-24 NOTE — Plan of Care (Signed)
  Problem: Activity: Goal: Interest or engagement in activities will improve Outcome: Progressing   Problem: Safety: Goal: Periods of time without injury will increase Outcome: Progressing  DAR NOTE: Patient presents with anxious affect and mood.  Denies suicidal thoughts, pain, auditory and visual hallucinations.  Rates depression at 3, hopelessness at 2, and anxiety at 4.  Maintained on routine safety checks.  Medications given as prescribed.  Support and encouragement offered as needed.  Attended group and participated.  States goal for today is "coping with my anxiety."  Patient observed socializing with peers in the dayroom.  Offered no complaint.  Abilify injectable given.  No adverse reaction noted.  Patient is safe on and off the unit.

## 2018-11-25 MED ORDER — TRAZODONE HCL 50 MG PO TABS
50.0000 mg | ORAL_TABLET | Freq: Every evening | ORAL | Status: DC | PRN
  Filled 2018-11-25: qty 7

## 2018-11-25 MED ORDER — HYDROXYZINE HCL 25 MG PO TABS: 25.0000 mg | ORAL_TABLET | Freq: Three times a day (TID) | ORAL | 0 refills | Status: DC | PRN

## 2018-11-25 MED ORDER — NICOTINE 21 MG/24HR TD PT24: 21.0000 mg | MEDICATED_PATCH | Freq: Every day | TRANSDERMAL | 0 refills | Status: DC

## 2018-11-25 MED ORDER — CARBAMAZEPINE 100 MG PO CHEW: 100.0000 mg | CHEWABLE_TABLET | Freq: Three times a day (TID) | ORAL | 0 refills | Status: DC

## 2018-11-25 MED ORDER — ARIPIPRAZOLE 5 MG PO TABS: 5.0000 mg | ORAL_TABLET | Freq: Every day | ORAL | 0 refills | Status: DC

## 2018-11-25 MED ORDER — TRAZODONE HCL 50 MG PO TABS: 50.0000 mg | ORAL_TABLET | Freq: Every evening | ORAL | 0 refills | Status: DC | PRN
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
## 2018-11-25 NOTE — Therapy (Signed)
Occupational Therapy Group Note  Date:  11/25/2018 Time:  3:06 PM  Group Topic/Focus:  Stress Management  Participation Level:  Active  Participation Quality:  Appropriate  Affect:  Flat  Cognitive:  Appropriate  Insight: Improving  Engagement in Group:  Engaged  Modes of Intervention:  Activity, Discussion, Education and Socialization  Additional Comments:    S: "I have bad boundaries in my relationship"  O: Education given on healthy stress management.Stress management tools worksheet discussed to differentiate negative vs positive coping skills. Pt asked to identify positive coping skills this date to use when reintegrating into community.  ?  A: Pt presents to group with blunted affect, engaged and participatory throughout session- but monopolizing and tangential. Stress management tools worksheet completed, pt stating he likes to utilize relaxation strategies. Pt taking handout on further education of relaxation strategies.  ?  P: Pt provided with education on stress management activities to implement into daily routine. Handouts given to facilitate carryover when reintegrating into community.   Dalphine HandingKaylee Jorene Kaylor, MSOT, OTR/L Behavioral Health OT/ Acute Relief OT PHP Office: [phone]  Dalphine HandingKaylee Deondra Labrador 11/25/2018, 3:06 PM

## 2018-11-25 NOTE — [facility] Suicide Risk Assessment (Signed)
[facility]   Principal Problem: MDD (major depressive disorder), recurrent, severe, with psychosis (HCC) Discharge Diagnoses: Principal Problem:   MDD (major depressive disorder), recurrent, severe, with psychosis (HCC)   Total Time spent with patient: 45 minutes   Mental Status Per Nursing Assessment::   On Admission:  Suicidal ideation indicated by others  Demographic Factors:  Caucasian  Loss Factors: Decrease in vocational status  Historical Factors: Impulsivity  Risk Reduction Factors:   NA  Continued Clinical Symptoms:  Depression:   Anhedonia  Cognitive Features That Contribute To Risk:  None    Suicide Risk:  Minimal: No identifiable suicidal ideation.  Patients presenting with no risk factors but with morbid ruminations; may be classified as minimal risk based on the severity of the depressive symptoms  Follow-up Information    [facility]. Go on 11/27/2018.   Specialty:  Behavioral Health Why:  Your hospital follow up appointment is Friday, 11/27/18 at 8:00a.  Please bring: photo ID, proof of insurance, social security card, and any discharge paperwork from this hospitalization.  Contact information: [address] [place] [address] [phone]           Plan Of Care/Follow-up recommendations:  Activity:  full  Colbin Jovel, MD 11/25/2018, 9:28 AM

## 2018-11-25 NOTE — Progress Notes (Signed)
Patient ID: Kyle Carney, male   DOB: 1996/06/26, 22 y.o.   MRN: [id number]  Pt discharged to lobby. Pt was stable and appreciative at that time. All papers and prescriptions were given and valuables returned. Verbal understanding expressed. Denies SI/HI and A/VH. Pt given opportunity to express concerns and ask questions.

## 2018-11-25 NOTE — Progress Notes (Signed)
Recreation Therapy Notes  Date: 12.18.19 Time: 0930 Location: 300 [address] Dayroom  Group Topic: Stress Management  Goal Area(s) Addresses:  Patient will verbalize importance of using healthy stress management.  Patient will identify positive emotions associated with healthy stress management.   Behavioral Response: Engaged  Intervention: Stress Management  Activity : Progressive Muscle Relaxation.  LRT introduced the stress management technique of progressive muscle relaxation.  LRT read Carney script guiding patients through the process of tensing and releasing each muscle one at Carney time.  Education:  Stress Management, Discharge Planning.   Education Outcome: Acknowledges edcuation/In group clarification offered/Needs additional education  Clinical Observations/Feedback: Pt attended group session.    Kyle Carney, LRT/CTRS         Kyle Carney 11/25/2018 11:42 AM 

## 2018-11-25 NOTE — Plan of Care (Signed)
D: Patient is alert, oriented, pleasant, and cooperative. Patient denies SI, HI, AVH, and verbally contracts for safety. Patient denies physical symptoms/pain.   A: Medications administered per MD order. Support provided. Patient educated on safety on the unit and medications. Routine safety checks every 15 minutes. Patient stated understanding to tell nurse about any new physical symptoms. Patient understands to tell staff of any needs.     R: No adverse drug reactions noted. Patient verbally contracts for safety. Patient remains safe at this time and will continue to monitor.   Problem: Education: Goal: Emotional status will improve Outcome: Progressing Goal: Mental status will improve Outcome: Progressing   Problem: Activity: Goal: Interest or engagement in activities will improve Outcome: Progressing   Problem: Safety: Goal: Periods of time without injury will increase Outcome: Progressing   Patient reports a good day and good mood. Patient denies SI. HI, AVH, and contracts for safety. Patient getting along well in the milieu and attending groups. Patient remains safe and will continue to monitor.

## 2018-11-25 NOTE — Progress Notes (Signed)
Patient ID: Kyle Carney, male   DOB: [birth year], 22 y.o.   MRN: [id number]  Pt currently presents with a flat affect and anxious behavior. Pt reports to Clinical research associatewriter that their goal is to "coping with anxiety." Intends to do so by "meditating." Pt states "I am wanted by the [employer], I'm looking at 20-25 when I get out here." Reports he was charged with possession of a firearm, marijuana and heroin. Pt reports okay sleep with current medication regimen.   Pt provided with medications per providers orders. Pt's labs and vitals were monitored throughout the night. Pt supported emotionally and encouraged to express concerns and questions. Pt educated on medications and coping skills for anxiety.   Pt's safety ensured with 15 minute and environmental checks. Pt currently denies SI/HI and A/V hallucinations. Pt verbally agrees to seek staff if SI/HI or A/VH occurs and to consult with staff before acting on any harmful thoughts. Will continue POC.

## 2018-11-25 NOTE — Progress Notes (Signed)
  [facility] :  Will you be returning to the same living situation after discharge:  Yes,  home At discharge, do you have transportation home?: Yes,  bus pass Do you have the ability to pay for your medications: Yes,  mental health  Release of information consent forms completed and submitted to medical records by CSW.   Patient to Follow up at: Follow-up Information    [facility]. Go on 11/27/2018.   Specialty:  Behavioral Health Why:  Your hospital follow up appointment is Friday, 11/27/18 at 8:00a.  Please bring: photo ID, proof of insurance, social security card, and any discharge paperwork from this hospitalization.  Contact information: [address] [place] [address] [phone]           Next level of care provider [facility] Link:no  Safety Planning and Suicide Prevention discussed: Yes,  SPE completed with pt and his mother. SPI pamphlet and mobile crisis information provided to pt.    Has patient been referred to the Quitline?: Patient refused referral  Patient has been referred for addiction treatment: Yes  Rona RavensHeather S Damani Kelemen, LCSW 11/25/2018, 9:45 AM

## 2019-01-26 ENCOUNTER — Emergency Department (HOSPITAL_COMMUNITY)
Admission: EM | Admit: 2019-01-26 | Discharge: 2019-01-27 | Disposition: A | Discharge status: Other Acute Inpatient Hospital | Payer: Self-pay | Attending: Emergency Medicine | Admitting: Emergency Medicine

## 2019-01-26 DIAGNOSIS — Z79899 Other long term (current) drug therapy: Secondary | ICD-10-CM | POA: Insufficient documentation

## 2019-01-26 DIAGNOSIS — Z87891 Personal history of nicotine dependence: Secondary | ICD-10-CM | POA: Insufficient documentation

## 2019-01-26 DIAGNOSIS — F1910 Other psychoactive substance abuse, uncomplicated: Secondary | ICD-10-CM | POA: Insufficient documentation

## 2019-01-26 DIAGNOSIS — Y929 Unspecified place or not applicable: Secondary | ICD-10-CM | POA: Insufficient documentation

## 2019-01-26 DIAGNOSIS — Y999 Unspecified external cause status: Secondary | ICD-10-CM | POA: Insufficient documentation

## 2019-01-26 DIAGNOSIS — S1091XA Abrasion of unspecified part of neck, initial encounter: Secondary | ICD-10-CM | POA: Insufficient documentation

## 2019-01-26 DIAGNOSIS — Z23 Encounter for immunization: Secondary | ICD-10-CM | POA: Insufficient documentation

## 2019-01-26 DIAGNOSIS — F319 Bipolar disorder, unspecified: Secondary | ICD-10-CM | POA: Insufficient documentation

## 2019-01-26 DIAGNOSIS — Y281XXA Contact with knife, undetermined intent, initial encounter: Secondary | ICD-10-CM | POA: Insufficient documentation

## 2019-01-26 DIAGNOSIS — Y939 Activity, unspecified: Secondary | ICD-10-CM | POA: Insufficient documentation

## 2019-01-26 LAB — RAPID URINE DRUG SCREEN, HOSP PERFORMED
Amphetamines: NOT DETECTED
Barbiturates: NOT DETECTED
Benzodiazepines: NOT DETECTED
Cocaine: POSITIVE — AB
Opiates: NOT DETECTED
Tetrahydrocannabinol: POSITIVE — AB

## 2019-01-26 LAB — COMPREHENSIVE METABOLIC PANEL
ALT: 29 U/L (ref 0–44)
ANION GAP: 13 (ref 5–15)
AST: 21 U/L (ref 15–41)
Albumin: 4.6 g/dL (ref 3.5–5.0)
Alkaline Phosphatase: 88 U/L (ref 38–126)
BUN: 11 mg/dL (ref 6–20)
CO2: 23 mmol/L (ref 22–32)
Calcium: 9.9 mg/dL (ref 8.9–10.3)
Chloride: 104 mmol/L (ref 98–111)
Creatinine, Ser: 0.86 mg/dL (ref 0.61–1.24)
GFR calc Af Amer: >60 mL/min (ref >60)
GFR calc non Af Amer: >60 mL/min (ref >60)
GLUCOSE: 94 mg/dL (ref 70–99)
Potassium: 3.5 mmol/L (ref 3.5–5.1)
Sodium: 140 mmol/L (ref 135–145)
Total Bilirubin: 0.7 mg/dL (ref 0.3–1.2)
Total Protein: 8 g/dL (ref 6.5–8.1)

## 2019-01-26 LAB — SALICYLATE LEVEL: Salicylate Lvl: <7 mg/dL (ref 2.8–30.0)

## 2019-01-26 LAB — CBC
HCT: 45.9 % (ref 39.0–52.0)
Hemoglobin: 15.8 g/dL (ref 13.0–17.0)
MCH: 31.4 pg (ref 26.0–34.0)
MCHC: 34.4 g/dL (ref 30.0–36.0)
MCV: 91.3 fL (ref 80.0–100.0)
Platelets: 333 10*3/uL (ref 150–400)
RBC: 5.03 MIL/uL (ref 4.22–5.81)
RDW: 12 % (ref 11.5–15.5)
WBC: 11.8 10*3/uL — ABNORMAL HIGH (ref 4.0–10.5)
nRBC: 0 % (ref 0.0–0.2)

## 2019-01-26 LAB — ETHANOL: Alcohol, Ethyl (B): <10 mg/dL (ref <10)

## 2019-01-26 LAB — ACETAMINOPHEN LEVEL

## 2019-01-26 MED ORDER — TETANUS-DIPHTH-ACELL PERTUSSIS 5-2.5-18.5 LF-MCG/0.5 IM SUSP
0.5000 mL | Freq: Once | INTRAMUSCULAR | Status: AC
  Administered 2019-01-26: 0.5 mL via INTRAMUSCULAR
  Filled 2019-01-26: qty 0.5

## 2019-01-26 NOTE — BH Assessment (Signed)
PA Donell Sievert recommends inpatient treatment and for the pt to be IVC'd.  TTS will look for bed placement for the pt.

## 2019-01-26 NOTE — BH Assessment (Addendum)
TTS spoke with pt due to pt becoming irritated, because he was thinking he was going home.  TTS explained to the pt that he is still supposed to go to [facility] and he can leave as soon as transportation is provided.  Pt stated he wants to go home, but still prefers to sign himself in versus being IVC'd.
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
## 2019-01-26 NOTE — BH Assessment (Signed)
Obtained verbal permission from pt to talk to his mother.  TTS attempted to contact the pt's mother for collateral information.  There was not an answer and a HIPPA compliant message was left on the voicemail.
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
## 2019-01-26 NOTE — ED Notes (Signed)
Patient signed voluntary consent form and RN has faxed to [facility]

## 2019-01-26 NOTE — BH Assessment (Addendum)
Pt is accepted to [facility]-2 and he can arrive when medically cleared.  The pt stated he is willing to sign himself in VOLUNTARILY.  The pt continues to be calm and cooperative.
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
## 2019-01-27 ENCOUNTER — Encounter (HOSPITAL_COMMUNITY): Payer: Self-pay

## 2019-01-27 ENCOUNTER — Inpatient Hospital Stay (HOSPITAL_COMMUNITY)
Admission: AD | Admit: 2019-01-27 | Discharge: 2019-01-29 | DRG: 885 | Disposition: A | Discharge status: Home / Self Care | Payer: Federal, State, Local not specified - Other | Source: Intra-hospital | Attending: Psychiatry | Admitting: Psychiatry

## 2019-01-27 ENCOUNTER — Other Ambulatory Visit: Payer: Self-pay

## 2019-01-27 DIAGNOSIS — Z91040 Latex allergy status: Secondary | ICD-10-CM

## 2019-01-27 DIAGNOSIS — F322 Major depressive disorder, single episode, severe without psychotic features: Secondary | ICD-10-CM | POA: Diagnosis not present

## 2019-01-27 DIAGNOSIS — Z881 Allergy status to other antibiotic agents status: Secondary | ICD-10-CM

## 2019-01-27 DIAGNOSIS — G43909 Migraine, unspecified, not intractable, without status migrainosus: Secondary | ICD-10-CM | POA: Diagnosis present

## 2019-01-27 DIAGNOSIS — F1490 Cocaine use, unspecified, uncomplicated: Secondary | ICD-10-CM | POA: Diagnosis present

## 2019-01-27 DIAGNOSIS — Z87891 Personal history of nicotine dependence: Secondary | ICD-10-CM | POA: Diagnosis not present

## 2019-01-27 DIAGNOSIS — F319 Bipolar disorder, unspecified: Secondary | ICD-10-CM | POA: Diagnosis present

## 2019-01-27 DIAGNOSIS — F4310 Post-traumatic stress disorder, unspecified: Secondary | ICD-10-CM | POA: Diagnosis present

## 2019-01-27 DIAGNOSIS — Z811 Family history of alcohol abuse and dependence: Secondary | ICD-10-CM

## 2019-01-27 DIAGNOSIS — F1910 Other psychoactive substance abuse, uncomplicated: Secondary | ICD-10-CM

## 2019-01-27 DIAGNOSIS — F1290 Cannabis use, unspecified, uncomplicated: Secondary | ICD-10-CM | POA: Diagnosis present

## 2019-01-27 MED ORDER — CARBAMAZEPINE 100 MG PO CHEW
100.0000 mg | CHEWABLE_TABLET | Freq: Three times a day (TID) | ORAL | Status: DC
  Administered 2019-01-27 – 2019-01-28 (×3): 100 mg via ORAL
  Filled 2019-01-27 (×10): qty 1

## 2019-01-27 MED ORDER — TRAZODONE HCL 50 MG PO TABS
50.0000 mg | ORAL_TABLET | Freq: Every evening | ORAL | Status: DC | PRN
  Administered 2019-01-27: 50 mg via ORAL
  Filled 2019-01-27 (×7): qty 1

## 2019-01-27 MED ORDER — ARIPIPRAZOLE 5 MG PO TABS
5.0000 mg | ORAL_TABLET | Freq: Every day | ORAL | Status: DC
  Administered 2019-01-27 – 2019-01-29 (×3): 5 mg via ORAL
  Filled 2019-01-27: qty 7
  Filled 2019-01-27 (×5): qty 1
  Filled 2019-01-27: qty 7
  Filled 2019-01-27: qty 1
  Filled 2019-01-27 (×2): qty 7

## 2019-01-27 MED ORDER — HYDROXYZINE HCL 25 MG PO TABS
25.0000 mg | ORAL_TABLET | Freq: Four times a day (QID) | ORAL | Status: DC | PRN
  Administered 2019-01-27 – 2019-01-28 (×2): 25 mg via ORAL
  Filled 2019-01-27 (×4): qty 1

## 2019-01-27 MED ORDER — NICOTINE 21 MG/24HR TD PT24
21.0000 mg | MEDICATED_PATCH | Freq: Every day | TRANSDERMAL | Status: DC
  Administered 2019-01-27 – 2019-01-29 (×3): 21 mg via TRANSDERMAL
  Filled 2019-01-27 (×6): qty 1

## 2019-01-27 MED ORDER — ACETAMINOPHEN 325 MG PO TABS
650.0000 mg | ORAL_TABLET | Freq: Four times a day (QID) | ORAL | Status: DC | PRN
  Administered 2019-01-27: 650 mg via ORAL
  Filled 2019-01-27: qty 2

## 2019-01-27 MED ORDER — MAGNESIUM HYDROXIDE 400 MG/5ML PO SUSP: 30.0000 mL | Freq: Every day | ORAL | Status: DC | PRN

## 2019-01-27 MED ORDER — ALUM & MAG HYDROXIDE-SIMETH 200-200-20 MG/5ML PO SUSP: 30.0000 mL | ORAL | Status: DC | PRN

## 2019-01-27 NOTE — Progress Notes (Signed)
Recreation Therapy Notes  Date:  2.19. 20 Time: 0930 Location: 300 Hall Dayroom  Group Topic: Stress Management  Goal Area(s) Addresses:  Patient will identify positive stress management techniques. Patient will identify benefits of using stress management post d/c.  Behavioral Response:  Engaged    Intervention: Stress Management  Activity :  Meditation.  LRT introduced the stress management technique of meditation.  LRT played a meditation that focused on pure possibilities.  Patients were to listen and follow along as meditation played to engage in activity.    Education:  Stress Management, Discharge Planning.   Education Outcome: Acknowledges Education  Clinical Observations/Feedback: Pt attended and participated in group.     Caroll Rancher, LRT/CTRS         Lillia Abed, Kellan Raffield A 01/27/2019 11:05 AM
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
## 2019-01-27 NOTE — Therapy (Signed)
Occupational Therapy Group Note  Date:  01/27/2019 Time:  11:54 AM  Group Topic/Focus:  Stress Management  Participation Level:  Active  Participation Quality:  Appropriate  Affect:  Animated  Cognitive:  Appropriate  Insight: Limited  Engagement in Group:  Engaged  Modes of Intervention:  Activity, Discussion, Education and Socialization  Additional Comments:    S: "Riding my dirt bike is a good form of stress"  O: Education given on healthy stress management, craft made as therapeutic activity to manage stress in relation to topic.Stress management tools worksheet discussed to differentiate negative vs positive coping skills. Pt asked to identify positive coping skills this date to use when reintegrating into community.   A: Pt presents to group with animated affect, engaged and participatory throughout session. Pt sharing that he often suppresses his stress, which then causes him to act out with severe consequences. Pt shares that he needs to work on his anger management. He shares riding his dirt bike and making YouTube videos on this is a good stress reliever for him.  P: OT treatment will be x1 per week while pt inpatient.  Dalphine Handing, MSOT, OTR/L Behavioral Health OT/ Acute Relief OT PHP Office: [phone]  Dalphine Handing 01/27/2019, 11:54 AM
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
## 2019-01-27 NOTE — [facility] Suicide Risk Assessment (Signed)
[facility] INPATIENT:  Family/Significant Other Suicide Prevention Education  Suicide Prevention Education:  Patient Refusal for Family/Significant Other Suicide Prevention Education: The patient Kyle Carney has refused to provide written consent for family/significant other to be provided Family/Significant Other Suicide Prevention Education during admission and/or prior to discharge.  Physician notified.  SPE completed with pt, as pt refused to consent to family contact. SPI pamphlet provided to pt and pt was encouraged to share information with support network, ask questions, and talk about any concerns relating to SPE. Pt denies access to guns/firearms and verbalized understanding of information provided. Mobile Crisis information also provided to pt.   Rona Ravens LCSW 01/27/2019, 11:55 AM

## 2019-01-27 NOTE — Progress Notes (Signed)
Nursing Progress Note: 7p-7a D: Pt currently presents with an anxious/pleasant affect and behavior. Interacting appropriately with the milieu. Pt reports good sleep during the previous night with current medication regimen. Pt did attend wrap-up group.  A: Pt provided with medications per providers orders. Pt's labs and vitals were monitored throughout the night. Pt supported emotionally and encouraged to express concerns and questions. Pt educated on medications.  R: Pt's safety ensured with 15 minute and environmental checks. Pt currently denies SI, HI, and AVH. Pt verbally contracts to seek staff if SI,HI, or AVH occurs and to consult with staff before acting on any harmful thoughts. Will continue to monitor.

## 2019-01-28 LAB — CARBAMAZEPINE LEVEL, TOTAL: Carbamazepine Lvl: <2 ug/mL — ABNORMAL LOW (ref 4.0–12.0)

## 2019-01-28 MED ORDER — TRAZODONE HCL 50 MG PO TABS
50.0000 mg | ORAL_TABLET | Freq: Every evening | ORAL | Status: DC | PRN
  Administered 2019-01-28: 50 mg via ORAL
  Filled 2019-01-28: qty 7
  Filled 2019-01-28: qty 1

## 2019-01-28 MED ORDER — ENSURE ENLIVE PO LIQD
237.0000 mL | Freq: Two times a day (BID) | ORAL | Status: DC
  Administered 2019-01-28 – 2019-01-29 (×2): 237 mL via ORAL

## 2019-01-28 MED ORDER — CARBAMAZEPINE 200 MG PO TABS
200.0000 mg | ORAL_TABLET | Freq: Two times a day (BID) | ORAL | Status: DC
  Administered 2019-01-29: 200 mg via ORAL
  Filled 2019-01-28: qty 1
  Filled 2019-01-28 (×2): qty 14
  Filled 2019-01-28: qty 1
  Filled 2019-01-28 (×2): qty 14

## 2019-01-28 NOTE — Plan of Care (Signed)
  Problem: Coping: Goal: Ability to demonstrate self-control will improve Outcome: Progressing Note:  Pt has maintained control of his behavior tonight.     

## 2019-01-28 NOTE — Progress Notes (Signed)
D: Pt was in hallway upon initial approach.  Pt presents with anxious affect and mood.  He describes his day as "amazing" and reports goal is "getting up out of here."  Pt reports he is discharging tomorrow and he feels safe to do so.  Pt denies SI/HI, denies hallucinations, denies pain.  Pt has been visible in milieu interacting with peers and staff appropriately.  Pt attended evening group.    A: Introduced self to pt.  Met with pt 1:1.  Actively listened to pt and offered support and encouragement.  PRN medication administered for anxiety and sleep.  Q15 minute safety checks maintained.  R: Pt is safe on the unit.  Pt is compliant with medications.  Pt verbally contracts for safety.  Will continue to monitor and assess.
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
## 2019-01-28 NOTE — Plan of Care (Signed)
Problem: Education: Goal: Knowledge of [facility] Education information/materials will improve Outcome: Progressing   Problem: Activity: Goal: Interest or engagement in activities will improve Outcome: Progressing   Problem: Coping: Goal: Ability to verbalize frustrations and anger appropriately will improve Outcome: Progressing   Problem: Safety: Goal: Periods of time without injury will increase Outcome: Progressing DAR Note: Pt noted in dayroom for majority of this shift. Observed interacting well with peers and staff. Denies SI, HI, AVH and pain when assessed. Anxious about possible discharge date "when am I going home, I'm fine right now". Attended groups as scheduled and was engaged in activities. Compliant with medications when offered. Denies side effects.   Encouragement, support and reassurance provided to pt as needed. Scheduled  medication given per provider's order with verbal education and effects monitored. Safety checks maintained at Q 15 minutes intervals. Pt tolerates all PO intake well. Remains safe on and off unit. POC continues for safety and mood stability.

## 2019-01-28 NOTE — [facility] Group Notes (Signed)
Date/Time 01/28/2019 1:15 PM  Type of Therapy/Topic:  Group Therapy:  Feelings about Diagnosis  Participation Level:  Active   Mood: Depressed   Description of Group:    This group will allow patients to explore their thoughts and feelings about diagnoses they have received. Patients will be guided to explore their level of understanding and acceptance of these diagnoses. Facilitator will encourage patients to process their thoughts and feelings about the reactions of others to their diagnosis, and will guide patients in identifying ways to discuss their diagnosis with significant others in their lives. This group will be process-oriented, with patients participating in exploration of their own experiences as well as giving and receiving support and challenge from other group members.   Therapeutic Goals: 1. Patient will demonstrate understanding of diagnosis as evidence by identifying two or more symptoms of the disorder:  2. Patient will be able to express two feelings regarding the diagnosis 3. Patient will demonstrate ability to communicate their needs through discussion and/or role plays  Summary of Patient Progress: Kyle "Sammy" attended the entire session. He shared that he believes "some diagnoses are real and some are fake".  He feels depressed about past  trauma that has caused PTSD. He believes therapy "may help you to focus".   Therapeutic Modalities:   Cognitive Behavioral Therapy Brief Therapy Feelings Identification   Marian Sorrow, MSW Intern 01/28/2019 3:52

## 2019-01-28 NOTE — Progress Notes (Signed)
Pt said his day was a 10. The one positive thing that happen his best friend from the 4th grade elementary came to visit. This was a good visit.
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
## 2019-01-29 MED ORDER — ARIPIPRAZOLE 5 MG PO TABS: 5.0000 mg | ORAL_TABLET | Freq: Every day | ORAL | 0 refills | Status: DC

## 2019-01-29 MED ORDER — TRAZODONE HCL 50 MG PO TABS: 50.0000 mg | ORAL_TABLET | Freq: Every evening | ORAL | 0 refills | Status: DC | PRN

## 2019-01-29 MED ORDER — HYDROXYZINE HCL 25 MG PO TABS: 25.0000 mg | ORAL_TABLET | Freq: Three times a day (TID) | ORAL | 0 refills | Status: DC | PRN

## 2019-01-29 MED ORDER — NICOTINE 21 MG/24HR TD PT24: 21.0000 mg | MEDICATED_PATCH | Freq: Every day | TRANSDERMAL | 0 refills | Status: DC

## 2019-01-29 MED ORDER — CARBAMAZEPINE 200 MG PO TABS: 200.0000 mg | ORAL_TABLET | Freq: Two times a day (BID) | ORAL | 0 refills | Status: DC

## 2019-01-29 NOTE — Progress Notes (Signed)
  [facility] Adult Case Management Discharge Plan :  Will you be returning to the same living situation after discharge:  Yes,  home At discharge, do you have transportation home?: Yes,  bus pass  Do you have the ability to pay for your medications: Yes,  mental health  Release of information consent forms completed and submitted to medical records by CSW.  Patient to Follow up at: Follow-up Information    [facility] Follow up on 02/02/2019.   Why:  Hospital follow up appointment is Tuesday, 2/25 at 8:00a. Please bring your photo ID, proof of insurance, current medications, and discharge paperwork from this hospitalization.  Contact information: [address] [place] [address] [phone]           Next level of care provider has access to [facility] Link:no  Safety Planning and Suicide Prevention discussed: Yes,  SPE completed with pt; pt declined to consent to collateral contact. SPI pamphlet and mobile crisis information provided.    Has patient been referred to the Quitline?: Patient refused referral  Patient has been referred for addiction treatment: Yes  Rona Ravens, LCSW 01/29/2019, 8:59 AM

## 2019-01-29 NOTE — Plan of Care (Signed)
  Problem: Education: Goal: Knowledge of [facility] Education information/materials will improve Outcome: Adequate for Discharge   Problem: Education: Goal: Emotional status will improve Outcome: Adequate for Discharge   Problem: Education: Goal: Mental status will improve Outcome: Adequate for Discharge   Problem: Education: Goal: Verbalization of understanding the information provided will improve Outcome: Adequate for Discharge   
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
## 2019-01-29 NOTE — Progress Notes (Signed)
Adult Psychoeducational Group Note  Date:  01/29/2019 Time:  8:49 AM  Group Topic/Focus:  Goals Group:   The focus of this group is to help patients establish daily goals to achieve during treatment and discuss how the patient can incorporate goal setting into their daily lives to aide in recovery.  Participation Level:  Active  Participation Quality:  Appropriate  Affect:  Appropriate  Cognitive:  Alert  Insight: Appropriate  Engagement in Group:  Engaged  Modes of Intervention:  Discussion  Additional Comments:  Patient's goal for today was to stay positive. In group, patient stated "the doctor told me to give him 2 good days and I will be able to discharge. I gave him 3, so hopefully I can discharge today". Patient has no complaints or concerns at this time.  Nymir Ringler L Margene Cherian 01/29/2019, 8:49 AM

## 2019-01-29 NOTE — Progress Notes (Signed)
Discharge note: Patient reviewed discharge paperwork with RN including prescriptions, follow up appointments, and lab work. Patient given the opportunity to ask questions. All concerns were addressed. All belongings were returned to patient. Denied SI/HI/AVH. Patient thanked staff for their care while at the hospital.  Patient was discharged to lobby where his friend was waiting to pick him up.

## 2019-05-04 ENCOUNTER — Emergency Department (HOSPITAL_COMMUNITY)
Admission: EM | Admit: 2019-05-04 | Discharge: 2019-05-04 | Disposition: A | Discharge status: Home / Self Care | Payer: Self-pay | Attending: Emergency Medicine | Admitting: Emergency Medicine

## 2019-05-04 DIAGNOSIS — Z9114 Patient's other noncompliance with medication regimen: Secondary | ICD-10-CM | POA: Insufficient documentation

## 2019-05-04 DIAGNOSIS — M542 Cervicalgia: Secondary | ICD-10-CM | POA: Insufficient documentation

## 2019-05-04 DIAGNOSIS — Z5321 Procedure and treatment not carried out due to patient leaving prior to being seen by health care provider: Secondary | ICD-10-CM | POA: Insufficient documentation

## 2019-05-04 DIAGNOSIS — R569 Unspecified convulsions: Secondary | ICD-10-CM | POA: Insufficient documentation

## 2019-05-04 DIAGNOSIS — M549 Dorsalgia, unspecified: Secondary | ICD-10-CM | POA: Insufficient documentation
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
## 2019-05-04 NOTE — ED Notes (Addendum)
Unable to finish triaging pt. He said he had to leave, got up, and immediately walked out.
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
## 2019-05-04 NOTE — ED Notes (Signed)
Pt told Katie, RN while triaging that "I'm leaving, I don't want to stay". Pt got up and was escorted out. Before leaving this NT attempted to take IV out of pt's hand but pt refused to let writer take IV out. Pt was stopped by security in triage to get IV out before leaving the campus but pt removed their own IV and was given guaze to apply.

## 2019-05-05 ENCOUNTER — Encounter (HOSPITAL_COMMUNITY): Payer: Self-pay | Admitting: Registered Nurse

## 2019-05-05 ENCOUNTER — Encounter (HOSPITAL_COMMUNITY): Payer: Self-pay

## 2019-05-05 ENCOUNTER — Emergency Department (HOSPITAL_COMMUNITY): Payer: Self-pay

## 2019-05-05 ENCOUNTER — Other Ambulatory Visit: Payer: Self-pay

## 2019-05-05 ENCOUNTER — Emergency Department (HOSPITAL_COMMUNITY)
Admission: EM | Admit: 2019-05-05 | Discharge: 2019-05-05 | Disposition: A | Discharge status: Other Acute Inpatient Hospital | Payer: Self-pay | Attending: Emergency Medicine | Admitting: Emergency Medicine

## 2019-05-05 ENCOUNTER — Observation Stay (HOSPITAL_COMMUNITY)
Admission: AD | Admit: 2019-05-05 | Discharge: 2019-05-05 | Disposition: A | Discharge status: Home / Self Care | Payer: Self-pay | Source: Intra-hospital | Attending: Psychiatry | Admitting: Psychiatry

## 2019-05-05 ENCOUNTER — Ambulatory Visit (HOSPITAL_COMMUNITY)
Admission: AD | Admit: 2019-05-05 | Discharge: 2019-05-05 | Disposition: A | Discharge status: Home / Self Care | Payer: Self-pay | Attending: Psychiatry | Admitting: Psychiatry

## 2019-05-05 DIAGNOSIS — F4325 Adjustment disorder with mixed disturbance of emotions and conduct: Secondary | ICD-10-CM

## 2019-05-05 DIAGNOSIS — Z87891 Personal history of nicotine dependence: Secondary | ICD-10-CM | POA: Insufficient documentation

## 2019-05-05 DIAGNOSIS — N35919 Unspecified urethral stricture, male, unspecified site: Secondary | ICD-10-CM | POA: Insufficient documentation

## 2019-05-05 DIAGNOSIS — Z79899 Other long term (current) drug therapy: Secondary | ICD-10-CM | POA: Insufficient documentation

## 2019-05-05 DIAGNOSIS — F332 Major depressive disorder, recurrent severe without psychotic features: Secondary | ICD-10-CM | POA: Diagnosis present

## 2019-05-05 DIAGNOSIS — F4310 Post-traumatic stress disorder, unspecified: Secondary | ICD-10-CM | POA: Insufficient documentation

## 2019-05-05 DIAGNOSIS — F1410 Cocaine abuse, uncomplicated: Secondary | ICD-10-CM | POA: Insufficient documentation

## 2019-05-05 DIAGNOSIS — R45851 Suicidal ideations: Secondary | ICD-10-CM | POA: Insufficient documentation

## 2019-05-05 DIAGNOSIS — Y9389 Activity, other specified: Secondary | ICD-10-CM | POA: Insufficient documentation

## 2019-05-05 DIAGNOSIS — Z9114 Patient's other noncompliance with medication regimen: Secondary | ICD-10-CM | POA: Insufficient documentation

## 2019-05-05 DIAGNOSIS — Z1159 Encounter for screening for other viral diseases: Secondary | ICD-10-CM | POA: Insufficient documentation

## 2019-05-05 DIAGNOSIS — F329 Major depressive disorder, single episode, unspecified: Secondary | ICD-10-CM | POA: Insufficient documentation

## 2019-05-05 DIAGNOSIS — F1210 Cannabis abuse, uncomplicated: Secondary | ICD-10-CM | POA: Insufficient documentation

## 2019-05-05 DIAGNOSIS — F319 Bipolar disorder, unspecified: Principal | ICD-10-CM | POA: Insufficient documentation

## 2019-05-05 DIAGNOSIS — Z046 Encounter for general psychiatric examination, requested by authority: Secondary | ICD-10-CM | POA: Insufficient documentation

## 2019-05-05 DIAGNOSIS — Y999 Unspecified external cause status: Secondary | ICD-10-CM | POA: Insufficient documentation

## 2019-05-05 DIAGNOSIS — Z91040 Latex allergy status: Secondary | ICD-10-CM | POA: Insufficient documentation

## 2019-05-05 DIAGNOSIS — S0990XA Unspecified injury of head, initial encounter: Secondary | ICD-10-CM | POA: Insufficient documentation

## 2019-05-05 DIAGNOSIS — Y92009 Unspecified place in unspecified non-institutional (private) residence as the place of occurrence of the external cause: Secondary | ICD-10-CM | POA: Insufficient documentation

## 2019-05-05 DIAGNOSIS — Z881 Allergy status to other antibiotic agents status: Secondary | ICD-10-CM | POA: Insufficient documentation

## 2019-05-05 LAB — RAPID URINE DRUG SCREEN, HOSP PERFORMED
Amphetamines: NOT DETECTED
Barbiturates: NOT DETECTED
Benzodiazepines: NOT DETECTED
Cocaine: POSITIVE — AB
Opiates: NOT DETECTED
Tetrahydrocannabinol: POSITIVE — AB

## 2019-05-05 LAB — COMPREHENSIVE METABOLIC PANEL
ALT: 38 U/L (ref 0–44)
AST: 24 U/L (ref 15–41)
Albumin: 4.8 g/dL (ref 3.5–5.0)
Alkaline Phosphatase: 89 U/L (ref 38–126)
Anion gap: 11 (ref 5–15)
BUN: 12 mg/dL (ref 6–20)
CO2: 21 mmol/L — ABNORMAL LOW (ref 22–32)
Calcium: 9.3 mg/dL (ref 8.9–10.3)
Chloride: 106 mmol/L (ref 98–111)
Creatinine, Ser: 0.79 mg/dL (ref 0.61–1.24)
GFR calc Af Amer: >60 mL/min (ref >60)
GFR calc non Af Amer: >60 mL/min (ref >60)
Glucose, Bld: 101 mg/dL — ABNORMAL HIGH (ref 70–99)
Potassium: 3.5 mmol/L (ref 3.5–5.1)
Sodium: 138 mmol/L (ref 135–145)
Total Bilirubin: 0.5 mg/dL (ref 0.3–1.2)
Total Protein: 7.9 g/dL (ref 6.5–8.1)

## 2019-05-05 LAB — CBC
HCT: 44.9 % (ref 39.0–52.0)
Hemoglobin: 15 g/dL (ref 13.0–17.0)
MCH: 31.9 pg (ref 26.0–34.0)
MCHC: 33.4 g/dL (ref 30.0–36.0)
MCV: 95.5 fL (ref 80.0–100.0)
Platelets: 275 10*3/uL (ref 150–400)
RBC: 4.7 MIL/uL (ref 4.22–5.81)
RDW: 12.4 % (ref 11.5–15.5)
WBC: 12.1 10*3/uL — ABNORMAL HIGH (ref 4.0–10.5)
nRBC: 0 % (ref 0.0–0.2)

## 2019-05-05 LAB — SARS CORONAVIRUS 2 BY RT PCR (HOSPITAL ORDER, PERFORMED IN ~~LOC~~ HOSPITAL LAB): SARS Coronavirus 2: NEGATIVE

## 2019-05-05 LAB — ETHANOL: Alcohol, Ethyl (B): <10 mg/dL (ref <10)

## 2019-05-05 MED ORDER — MAGNESIUM HYDROXIDE 400 MG/5ML PO SUSP: 30.0000 mL | Freq: Every day | ORAL | Status: DC | PRN

## 2019-05-05 MED ORDER — ALUM & MAG HYDROXIDE-SIMETH 200-200-20 MG/5ML PO SUSP: 30.0000 mL | ORAL | Status: DC | PRN

## 2019-05-05 MED ORDER — ACETAMINOPHEN 325 MG PO TABS: 650.0000 mg | ORAL_TABLET | Freq: Four times a day (QID) | ORAL | Status: DC | PRN

## 2019-05-05 MED ORDER — HYDROXYZINE HCL 25 MG PO TABS: 25.0000 mg | ORAL_TABLET | Freq: Three times a day (TID) | ORAL | Status: DC | PRN
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
## 2019-05-05 NOTE — Progress Notes (Signed)
[place] NOVEL CORONAVIRUS (COVID-19) DAILY CHECK-OFF SYMPTOMS - answer yes or no to each - every day NO YES  Have you had a fever in the past 24 hours?  . Fever (Temp > 37.80C / 100F) X   Have you had any of these symptoms in the past 24 hours? . New Cough .  Sore Throat  .  Shortness of Breath .  Difficulty Breathing .  Unexplained Body Aches   X   Have you had any one of these symptoms in the past 24 hours not related to allergies?   . Runny Nose .  Nasal Congestion .  Sneezing   X   If you have had runny nose, nasal congestion, sneezing in the past 24 hours, has it worsened?  X   EXPOSURES - check yes or no X   Have you traveled outside the state in the past 14 days?  X   Have you been in contact with someone with a confirmed diagnosis of COVID-19 or PUI in the past 14 days without wearing appropriate PPE?  X   Have you been living in the same home as a person with confirmed diagnosis of COVID-19 or a PUI (household contact)?    X   Have you been diagnosed with COVID-19?    X              What to do next: Answered NO to all: Answered YES to anything:   Proceed with unit schedule Follow the [facility] Inpatient Flowsheet.   

## 2019-05-05 NOTE — ED Notes (Signed)
Pt belongings:  Orthoptist Pants Green Shirt [employer]

## 2019-05-05 NOTE — ED Notes (Signed)
Pt was pleasant and compliant with blood draw, COVID swab, urine and head CT.
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
## 2019-05-05 NOTE — ED Triage Notes (Signed)
Pt was here earlier and left AMA, his sister IVC'd him. She states that he's violent doesn't take his medications. He has threatened suicide and hears voices. Pt is scared of the police and has a hx of seizures.

## 2019-05-05 NOTE — BH Assessment (Signed)
[facility] Assessment Progress Note  Per Juanetta Beets, DO, this pt does not require psychiatric hospitalization at this time.  Pt presents under IVC initiated by pt's sister, which Dr Sharma Covert has rescinded.  Pt is to be discharged from [facility] with referral information for [facility] for pt's mental health needs, as well as area substance abuse treatment providers.  These have been included in pt's discharge instructions.  Pt's nurse, Lincoln Maxin, has been notified.  Doylene Canning, MA Triage Specialist [phone]
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
## 2019-05-05 NOTE — Progress Notes (Signed)
D: Pt A & O X 4. Presents fidgety and forwards on interactions. Denies SI, HI, AVH and pain at this time. D/C home as ordered. Picked up in front of facility by his girlfriend.   A: D/C instructions reviewed with pt including follow up referrals. Compliance encouraged. All belongings from locker # 53 returned to patient including ankle bracelet charger at time of d/c. Safety checks maintained without incident till time of d/c.  R: Pt receptive to care. Verbalized understanding related to d/c instructions. Signed belonging sheet in agreement with items received from locker. Ambulatory with a steady gait. Appears to be in no physical distress at time of departure.

## 2019-05-05 NOTE — H&P (Signed)
Behavioral Health Medical Screening Exam  Kyle Carney is an 23 y.o. male.  Total Time spent with patient: 30 minutes Total Time spent with patient: 30 minutes  Psychiatric Specialty Exam: Physical Exam  Constitutional: He is oriented to person, place, and time. He appears well-developed and well-nourished. No distress.  HENT:  Head: Normocephalic and atraumatic.  Right Ear: External ear normal.  Left Ear: External ear normal.  Eyes: Pupils are equal, round, and reactive to light. Conjunctivae are normal. Right eye exhibits no discharge. Left eye exhibits no discharge.  Respiratory: Effort normal. No respiratory distress.  Musculoskeletal: Normal range of motion.  Neurological: He is alert and oriented to person, place, and time.  Skin: He is not diaphoretic.  Psychiatric: His mood appears anxious. His speech is not rapid and/or pressured and not tangential. He is not withdrawn and not actively hallucinating. Thought content is not paranoid and not delusional. He expresses impulsivity and inappropriate judgment. He expresses no homicidal and no suicidal ideation.    Review of Systems  Constitutional: Negative for chills, diaphoresis, fever, malaise/fatigue and weight loss.  Eyes: Positive for blurred vision.  Respiratory: Negative for cough and shortness of breath.   Cardiovascular: Negative for chest pain.  Gastrointestinal: Negative for diarrhea, nausea and vomiting.  Neurological: Positive for dizziness and headaches.  Psychiatric/Behavioral: Positive for substance abuse. Negative for depression, hallucinations, memory loss and suicidal ideas. The patient is nervous/anxious. The patient does not have insomnia.     There were no vitals taken for this visit.There is no height or weight on file to calculate BMI.  General Appearance: Casual and Fairly Groomed  Eye Contact:  Good  Speech:  Clear and Coherent and Normal Rate  Volume:  Normal  Mood:  Anxious  Affect:   Congruent  Thought Process:  Coherent, Goal Directed and Descriptions of Associations: Intact  Orientation:  Full (Time, Place, and Person)  Thought Content:  Logical  Suicidal Thoughts:  Denies  Homicidal Thoughts:  Denies  Memory:  Immediate;   Good Recent;   Fair  Judgement:  Fair  Insight:  Fair  Psychomotor Activity:  Normal  Concentration: Concentration: Fair and Attention Span: Fair  Recall:  Good  Fund of Knowledge:Good  Language: Good  Akathisia:  Negative  Handed:  Right  AIMS (if indicated):     Assets:  Communication Skills Desire for Improvement Housing Intimacy Leisure Time Physical Health  Sleep:       Musculoskeletal: Strength & Muscle Tone: within normal limits Gait & Station: normal    Recommendations:  Based on my evaluation the patient does not appear to have an emergency medical condition. Will send for medical clearance due to possible head injury, dizziness, blurred vision.  Jackelyn Poling, NP 05/05/2019, 6:27 AM

## 2019-05-05 NOTE — Progress Notes (Signed)
[place] NOVEL CORONAVIRUS (COVID-19) DAILY CHECK-OFF SYMPTOMS - answer yes or no to each - every day NO YES  Have you had a fever in the past 24 hours?  . Fever (Temp > 37.80C / 100F) X   Have you had any of these symptoms in the past 24 hours? . New Cough .  Sore Throat  .  Shortness of Breath .  Difficulty Breathing .  Unexplained Body Aches   X   Have you had any one of these symptoms in the past 24 hours not related to allergies?   . Runny Nose .  Nasal Congestion .  Sneezing   X   If you have had runny nose, nasal congestion, sneezing in the past 24 hours, has it worsened?  X   EXPOSURES - check yes or no X   Have you traveled outside the state in the past 14 days?  X   Have you been in contact with someone with a confirmed diagnosis of COVID-19 or PUI in the past 14 days without wearing appropriate PPE?  X   Have you been living in the same home as a person with confirmed diagnosis of COVID-19 or a PUI (household contact)?    X   Have you been diagnosed with COVID-19?    X              What to do next: Answered NO to all: Answered YES to anything:   Proceed with unit schedule Follow the [facility] Inpatient Flowsheet.   

## 2019-05-05 NOTE — Plan of Care (Signed)
[facility] Observation Crisis Plan  Reason for Crisis Plan:  Chronic Mental Illness/Medical Illness and Substance Abuse   Plan of Care:  Referral for Inpatient Hospitalization  Family Support:  Mother, Sister   Current Living Environment:  Home with mother  Insurance:   Hospital Account    Name Acct ID Class Status Primary Coverage   Kyle Carney, Kyle Carney [id number] BEHAVIORAL HEALTH OBSERVATION Open None        Guarantor Account (for Hospital Account [id number])    Name Relation to Pt Service Area Active? Acct Type   Kyle Carney, Kyle Carney Self [facility] Yes Behavioral Health   Address Phone       [address] [place], [place] [address] [phone](H)          Coverage Information (for Hospital Account [id number])    Not on file      Legal Guardian:  Pt own guardian.   Primary Care Provider:  Melanie Crazier, Kyle Carney  Current Outpatient Providers:  Kyle Carney  Psychiatrist:      Counselor/Therapist:     Compliant with Medications:  No  Additional Information:   Kyle Carney 5/27/20205:30 AM
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
## 2019-05-05 NOTE — Discharge Instructions (Signed)
For your mental health needs, you are advised to follow up with [facility].  Call them at your earliest opportunity to schedule an intake appointment:       [facility]      [address]      [place], [place] [address]      [phone]      Crisis number: [phone]  To help you maintain a sober lifestyle, a substance abuse treatment program may be beneficial to you.  Contact one of the following facilities at your earliest opportunity to ask about enrolling:  RESIDENTIAL PROGRAMS:       [facility]      [address] [place], [place] [address]      [phone]       [facility]      [address] [place], [place] [address]      [phone]       [facility]      [address]      [place], [place] [address]      [phone]  OUTPATIENT PROGRAMS:  For your behavioral health needs, you are advised to follow up with [facility].  Call them at your earliest opportunity to schedule an intake appointment:       [facility]      [address]      [place], [place] [address]      [phone]

## 2019-08-17 ENCOUNTER — Emergency Department (HOSPITAL_COMMUNITY)
Admission: EM | Admit: 2019-08-17 | Discharge: 2019-08-17 | Disposition: A | Discharge status: Home / Self Care | Payer: Self-pay | Attending: Emergency Medicine | Admitting: Emergency Medicine

## 2019-08-17 ENCOUNTER — Emergency Department (HOSPITAL_COMMUNITY): Payer: Self-pay

## 2019-08-17 ENCOUNTER — Other Ambulatory Visit: Payer: Self-pay

## 2019-08-17 ENCOUNTER — Encounter (HOSPITAL_COMMUNITY): Payer: Self-pay | Admitting: Emergency Medicine

## 2019-08-17 DIAGNOSIS — R6884 Jaw pain: Secondary | ICD-10-CM | POA: Insufficient documentation

## 2019-08-17 DIAGNOSIS — Z23 Encounter for immunization: Secondary | ICD-10-CM | POA: Insufficient documentation

## 2019-08-17 DIAGNOSIS — M545 Low back pain, unspecified: Secondary | ICD-10-CM

## 2019-08-17 DIAGNOSIS — R51 Headache: Secondary | ICD-10-CM | POA: Insufficient documentation

## 2019-08-17 DIAGNOSIS — Z87891 Personal history of nicotine dependence: Secondary | ICD-10-CM | POA: Insufficient documentation

## 2019-08-17 MED ORDER — TETANUS-DIPHTH-ACELL PERTUSSIS 5-2.5-18.5 LF-MCG/0.5 IM SUSP
0.5000 mL | Freq: Once | INTRAMUSCULAR | Status: AC
  Administered 2019-08-17: 0.5 mL via INTRAMUSCULAR
  Filled 2019-08-17: qty 0.5
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
## 2019-08-17 NOTE — Discharge Instructions (Signed)
You CT scan of your head and your face did not show any abnormality. The xray of you back did not show any abnormality as well. Take tylenol and ibuprofen as needed for pain.  Do not take more than 4000 mg of Tylenol or more than 2400 mg ibuprofen daily.  You may placed ice to the areas.  Return for any new worsening symptoms.

## 2020-12-13 ENCOUNTER — Emergency Department (HOSPITAL_COMMUNITY)
Admission: EM | Admit: 2020-12-13 | Discharge: 2020-12-13 | Disposition: A | Discharge status: Home / Self Care | Payer: Self-pay

## 2021-05-22 ENCOUNTER — Emergency Department (HOSPITAL_COMMUNITY): Admission: EM | Admit: 2021-05-22 | Payer: Managed Care, Other (non HMO) | Source: Home / Self Care

## 2021-05-22 ENCOUNTER — Other Ambulatory Visit: Payer: Self-pay

## 2021-05-22 NOTE — ED Notes (Signed)
Unable to locate pt in lobby. Called x1

## 2021-07-16 ENCOUNTER — Emergency Department (HOSPITAL_COMMUNITY)
Admission: EM | Admit: 2021-07-16 | Discharge: 2021-07-16 | Disposition: A | Discharge status: Home / Self Care | Payer: Managed Care, Other (non HMO) | Attending: Emergency Medicine | Admitting: Emergency Medicine

## 2021-07-16 ENCOUNTER — Encounter (HOSPITAL_COMMUNITY): Payer: Self-pay

## 2021-07-16 ENCOUNTER — Other Ambulatory Visit: Payer: Self-pay

## 2021-07-16 DIAGNOSIS — R569 Unspecified convulsions: Secondary | ICD-10-CM | POA: Diagnosis not present

## 2021-07-16 DIAGNOSIS — Z5321 Procedure and treatment not carried out due to patient leaving prior to being seen by health care provider: Secondary | ICD-10-CM | POA: Insufficient documentation

## 2021-07-16 DIAGNOSIS — M549 Dorsalgia, unspecified: Secondary | ICD-10-CM | POA: Insufficient documentation

## 2021-07-16 DIAGNOSIS — M542 Cervicalgia: Secondary | ICD-10-CM | POA: Insufficient documentation

## 2021-07-16 DIAGNOSIS — R519 Headache, unspecified: Secondary | ICD-10-CM | POA: Insufficient documentation
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
## 2021-10-02 ENCOUNTER — Emergency Department (HOSPITAL_COMMUNITY)
Admission: EM | Admit: 2021-10-02 | Discharge: 2021-10-02 | Disposition: A | Discharge status: Home / Self Care | Payer: Managed Care, Other (non HMO) | Attending: Emergency Medicine | Admitting: Emergency Medicine

## 2021-10-02 ENCOUNTER — Encounter (HOSPITAL_COMMUNITY): Payer: Self-pay | Admitting: Emergency Medicine

## 2021-10-02 DIAGNOSIS — K0889 Other specified disorders of teeth and supporting structures: Secondary | ICD-10-CM | POA: Insufficient documentation

## 2021-10-02 DIAGNOSIS — H9319 Tinnitus, unspecified ear: Secondary | ICD-10-CM | POA: Insufficient documentation

## 2021-10-02 DIAGNOSIS — Z5321 Procedure and treatment not carried out due to patient leaving prior to being seen by health care provider: Secondary | ICD-10-CM | POA: Insufficient documentation

## 2021-10-02 DIAGNOSIS — K0381 Cracked tooth: Secondary | ICD-10-CM | POA: Insufficient documentation

## 2021-10-02 NOTE — ED Triage Notes (Signed)
Pt reports dental pain, one tooth is out and one is rotted. Pt requesting abx shot. States he hasn't seen a dentist due to insurance.

## 2021-10-02 NOTE — ED Triage Notes (Addendum)
Pt states cracked his tooth while eating 2 weeks ago.  Now tooth is yellow.  Has been taking tylenol for pain and is experiencing ringing in his ear.  122/78 Hr 88 Rr 28 98 % ra

## 2021-10-02 NOTE — ED Notes (Signed)
Patient called x2 for room assignment with no response  

## 2022-04-11 ENCOUNTER — Encounter (HOSPITAL_COMMUNITY): Payer: Self-pay | Admitting: Radiology

## 2022-04-11 ENCOUNTER — Emergency Department (HOSPITAL_COMMUNITY)
Admission: EM | Admit: 2022-04-11 | Discharge: 2022-04-11 | Disposition: A | Discharge status: Home / Self Care | Payer: Self-pay | Attending: Emergency Medicine | Admitting: Emergency Medicine

## 2022-04-11 DIAGNOSIS — T50901A Poisoning by unspecified drugs, medicaments and biological substances, accidental (unintentional), initial encounter: Secondary | ICD-10-CM

## 2022-04-11 DIAGNOSIS — Z91040 Latex allergy status: Secondary | ICD-10-CM | POA: Insufficient documentation

## 2022-04-11 DIAGNOSIS — K0381 Cracked tooth: Secondary | ICD-10-CM | POA: Insufficient documentation

## 2022-04-11 DIAGNOSIS — T40711A Poisoning by cannabis, accidental (unintentional), initial encounter: Secondary | ICD-10-CM | POA: Insufficient documentation

## 2022-04-11 DIAGNOSIS — T40601A Poisoning by unspecified narcotics, accidental (unintentional), initial encounter: Secondary | ICD-10-CM | POA: Insufficient documentation

## 2022-04-11 LAB — RAPID URINE DRUG SCREEN, HOSP PERFORMED
Amphetamines: NOT DETECTED
Barbiturates: NOT DETECTED
Benzodiazepines: NOT DETECTED
Cocaine: NOT DETECTED
Opiates: POSITIVE — AB
Tetrahydrocannabinol: POSITIVE — AB

## 2022-04-11 MED ORDER — AMOXICILLIN 500 MG PO CAPS: 500.0000 mg | ORAL_CAPSULE | Freq: Three times a day (TID) | ORAL | 0 refills | Status: DC
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
## 2022-04-11 NOTE — Discharge Instructions (Addendum)
You remained stable for duration of your emergency room stay.  Your drug screen showed opiates and marijuana in your system.  You state you took these for your dental pain.  I have sent in antibiotics to the pharmacy for you.  Have also attached dental referral for you above.  Please call and schedule a follow-up appointment with a dentist.  If you have any worsening or concerning symptoms return for evaluation. ?

## 2022-04-27 ENCOUNTER — Encounter (HOSPITAL_COMMUNITY): Payer: Self-pay | Admitting: Emergency Medicine

## 2022-04-27 ENCOUNTER — Other Ambulatory Visit: Payer: Self-pay

## 2022-04-27 ENCOUNTER — Emergency Department (HOSPITAL_COMMUNITY)
Admission: EM | Admit: 2022-04-27 | Discharge: 2022-04-27 | Disposition: A | Discharge status: Home / Self Care | Payer: Self-pay | Attending: Emergency Medicine | Admitting: Emergency Medicine

## 2022-04-27 DIAGNOSIS — L290 Pruritus ani: Secondary | ICD-10-CM

## 2022-04-27 DIAGNOSIS — Z91040 Latex allergy status: Secondary | ICD-10-CM | POA: Insufficient documentation

## 2022-04-27 DIAGNOSIS — K602 Anal fissure, unspecified: Secondary | ICD-10-CM | POA: Insufficient documentation

## 2022-04-27 DIAGNOSIS — K6289 Other specified diseases of anus and rectum: Secondary | ICD-10-CM

## 2022-04-27 MED ORDER — DOCUSATE SODIUM 100 MG PO CAPS: 100.0000 mg | ORAL_CAPSULE | Freq: Two times a day (BID) | ORAL | 0 refills | Status: AC

## 2022-04-27 MED ORDER — HYDROCORTISONE ACETATE 25 MG RE SUPP: 25.0000 mg | Freq: Two times a day (BID) | RECTAL | 0 refills | Status: AC
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
## 2022-04-27 NOTE — ED Notes (Signed)
Discharge instructions reviewed with patient. Patient verbalized understanding of instructions. Follow-up care and medications were reviewed. Patient ambulatory with steady gait. VSS upon discharge.  ?
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
## 2023-09-07 ENCOUNTER — Emergency Department (HOSPITAL_COMMUNITY)
Admission: EM | Admit: 2023-09-07 | Discharge: 2023-09-08 | Disposition: A | Discharge status: Home / Self Care | Payer: Self-pay | Attending: Emergency Medicine | Admitting: Emergency Medicine

## 2023-09-07 ENCOUNTER — Other Ambulatory Visit: Payer: Self-pay

## 2023-09-07 DIAGNOSIS — S40862A Insect bite (nonvenomous) of left upper arm, initial encounter: Secondary | ICD-10-CM | POA: Diagnosis present

## 2023-09-07 DIAGNOSIS — T63304A Toxic effect of unspecified spider venom, undetermined, initial encounter: Secondary | ICD-10-CM

## 2023-09-07 DIAGNOSIS — Z91040 Latex allergy status: Secondary | ICD-10-CM | POA: Diagnosis not present

## 2023-09-07 DIAGNOSIS — W57XXXA Bitten or stung by nonvenomous insect and other nonvenomous arthropods, initial encounter: Secondary | ICD-10-CM | POA: Diagnosis not present

## 2023-09-07 NOTE — ED Triage Notes (Signed)
Patient reports multiple spider bites at right hand and left upper arm this evening with mild itching/redness , airway intact , no oral swelling /respirations unlabored .

## 2023-09-08 ENCOUNTER — Emergency Department (HOSPITAL_COMMUNITY): Payer: Self-pay

## 2023-09-08 MED ORDER — DEXAMETHASONE SODIUM PHOSPHATE 10 MG/ML IJ SOLN
10.0000 mg | Freq: Once | INTRAMUSCULAR | Status: AC
  Administered 2023-09-08: 10 mg via INTRAMUSCULAR
  Filled 2023-09-08: qty 1

## 2023-09-08 MED ORDER — FAMOTIDINE 20 MG PO TABS
20.0000 mg | ORAL_TABLET | Freq: Once | ORAL | Status: AC
  Administered 2023-09-08: 20 mg via ORAL
  Filled 2023-09-08: qty 1

## 2023-09-08 MED ORDER — DIPHENHYDRAMINE HCL 25 MG PO CAPS
50.0000 mg | ORAL_CAPSULE | Freq: Once | ORAL | Status: AC
  Administered 2023-09-08: 50 mg via ORAL
  Filled 2023-09-08: qty 2

## 2023-09-08 NOTE — ED Provider Notes (Signed)
[address] EMERGENCY DEPARTMENT AT [facility] Provider Note   CSN: [id number] Arrival date & time: 09/07/23  2238     History  Chief Complaint  Patient presents with   Spider Bites    Kyle Carney is a 27 y.o. male.  Presents to the emergency department for evaluation of multiple spider bites.  Patient reports being bit on the right hand and wrist multiple times, also on the right collarbone and left upper arm.  All areas are very itchy.  Patient reports that he thought he would feel better at first but then he started having tightness in his chest and feeling like he was not breathing well.  No history of asthma.  No cough.       Home Medications Prior to Admission medications   Medication Sig Start Date End Date Taking? Authorizing Provider  docusate sodium (COLACE) 100 MG capsule Take 1 capsule (100 mg total) by mouth every 12 (twelve) hours. 04/27/22   Mesner, Barbara Cower, MD  hydrocortisone (ANUSOL-HC) 25 MG suppository Place 1 suppository (25 mg total) rectally 2 (two) times daily. 04/27/22   Mesner, Barbara Cower, MD      Allergies    Erythromycin and Latex    Review of Systems   Review of Systems  Physical Exam Updated Vital Signs BP 125/79 (BP Location: Right Arm)   Pulse 75   Temp 98.6 F (37 C)   Resp 18   SpO2 98%  Physical Exam Vitals and nursing note reviewed.  Constitutional:      General: He is not in acute distress.    Appearance: He is well-developed.  HENT:     Head: Normocephalic and atraumatic.     Mouth/Throat:     Mouth: Mucous membranes are moist.  Eyes:     General: Vision grossly intact. Gaze aligned appropriately.     Extraocular Movements: Extraocular movements intact.     Conjunctiva/sclera: Conjunctivae normal.  Cardiovascular:     Rate and Rhythm: Normal rate and regular rhythm.     Pulses: Normal pulses.     Heart sounds: Normal heart sounds, S1 normal and S2 normal. No murmur heard.    No friction rub. No gallop.   Pulmonary:     Effort: Pulmonary effort is normal. No respiratory distress.     Breath sounds: Normal breath sounds.  Abdominal:     Palpations: Abdomen is soft.     Tenderness: There is no abdominal tenderness. There is no guarding or rebound.     Hernia: No hernia is present.  Musculoskeletal:        General: No swelling.     Cervical back: Full passive range of motion without pain, normal range of motion and neck supple. No pain with movement, spinous process tenderness or muscular tenderness. Normal range of motion.     Right lower leg: No edema.     Left lower leg: No edema.  Skin:    General: Skin is warm and dry.     Capillary Refill: Capillary refill takes less than 2 seconds.     Findings: Erythema (Left tricep area) and wound (Multiple areas of scabbing on extremities) present. No ecchymosis or lesion.  Neurological:     Mental Status: He is alert and oriented to person, place, and time.     GCS: GCS eye subscore is 4. GCS verbal subscore is 5. GCS motor subscore is 6.     Cranial Nerves: Cranial nerves 2-12 are intact.  Sensory: Sensation is intact.     Motor: Motor function is intact. No weakness or abnormal muscle tone.     Coordination: Coordination is intact.  Psychiatric:        Mood and Affect: Mood normal.        Speech: Speech normal.        Behavior: Behavior normal.     ED Results / Procedures / Treatments   Labs (all labs ordered are listed, but only abnormal results are displayed) Labs Reviewed - No data to display  EKG None  Radiology DG Chest 2 View  Result Date: 09/08/2023 CLINICAL DATA:  Shortness of breath EXAM: CHEST - 2 VIEW COMPARISON:  02/25/2018 FINDINGS: The heart size and mediastinal contours are within normal limits. Both lungs are clear. The visualized skeletal structures are unremarkable. IMPRESSION: No active cardiopulmonary disease. Electronically Signed   By: Alcide Clever M.D.   On: 09/08/2023 03:30    Procedures Procedures     Medications Ordered in ED Medications  diphenhydrAMINE (BENADRYL) capsule 50 mg (has no administration in time range)  famotidine (PEPCID) tablet 20 mg (has no administration in time range)  dexamethasone (DECADRON) injection 10 mg (has no administration in time range)    ED Course/ Medical Decision Making/ A&P                                 Medical Decision Making Amount and/or Complexity of Data Reviewed Radiology: ordered.  Risk Prescription drug management.   Presents for evaluation of spider bites.  Patient with diffuse itching.  Patient does have an area of erythremia surrounding the bite on the left arm.  There are multiple other scabs which she thinks are spider bites as well without signs of local reaction.  Patient's vital signs are unremarkable.  He does report some tightness with his breathing.  No hypoxia.  Chest x-ray is clear.  Treated as allergic reaction.  No signs of anaphylaxis.        Final Clinical Impression(s) / ED Diagnoses Final diagnoses:  Spider bite wound, undetermined intent, initial encounter    Rx / DC Orders ED Discharge Orders     None         Deveron Shamoon, Canary Brim, MD 09/08/23 [phone]

## 2023-09-08 NOTE — Discharge Instructions (Signed)
Take benadryl every 6 hours for the next few days for itching. Ice the areas.

## 2024-10-12 ENCOUNTER — Ambulatory Visit (HOSPITAL_COMMUNITY): Admission: EM | Admit: 2024-10-12 | Discharge: 2024-10-12 | Discharge status: Against Medical Advice | Payer: MEDICAID

## 2024-10-12 NOTE — ED Notes (Signed)
Called x's 3 without response 

## 2024-10-12 NOTE — ED Notes (Signed)
 No answer in waiting x 3

## 2024-10-12 NOTE — ED Notes (Signed)
 PT not present in front lobby or in front of building.
